# Patient Record
Sex: Male | Born: 1965 | Race: White | Hispanic: No | Marital: Married | Smoking: Never smoker
Health system: Southern US, Community
[De-identification: ages and names within clinical notes are randomized; demographics above are authoritative.]

## PROBLEM LIST (undated history)

## (undated) HISTORY — PX: OTHER SURGICAL HISTORY: SHX169

## (undated) HISTORY — PX: VASECTOMY: SHX75

---

## 1999-08-09 ENCOUNTER — Other Ambulatory Visit: Admission: RE | Admit: 1999-08-09 | Discharge: 1999-08-09 | Payer: Self-pay | Admitting: Urology

## 2012-07-01 ENCOUNTER — Encounter (HOSPITAL_BASED_OUTPATIENT_CLINIC_OR_DEPARTMENT_OTHER): Payer: Self-pay

## 2012-07-01 ENCOUNTER — Emergency Department (HOSPITAL_BASED_OUTPATIENT_CLINIC_OR_DEPARTMENT_OTHER)
Admission: EM | Admit: 2012-07-01 | Discharge: 2012-07-02 | Disposition: A | Discharge status: Home / Self Care | Payer: BC Managed Care – PPO | Attending: Gastroenterology | Admitting: Gastroenterology

## 2012-07-01 DIAGNOSIS — Y929 Unspecified place or not applicable: Secondary | ICD-10-CM | POA: Insufficient documentation

## 2012-07-01 DIAGNOSIS — Y939 Activity, unspecified: Secondary | ICD-10-CM | POA: Insufficient documentation

## 2012-07-01 DIAGNOSIS — IMO0002 Reserved for concepts with insufficient information to code with codable children: Secondary | ICD-10-CM | POA: Insufficient documentation

## 2012-07-01 DIAGNOSIS — K117 Disturbances of salivary secretion: Secondary | ICD-10-CM | POA: Insufficient documentation

## 2012-07-01 DIAGNOSIS — K208 Other esophagitis without bleeding: Secondary | ICD-10-CM | POA: Insufficient documentation

## 2012-07-01 DIAGNOSIS — R1310 Dysphagia, unspecified: Secondary | ICD-10-CM | POA: Insufficient documentation

## 2012-07-01 DIAGNOSIS — T18108A Unspecified foreign body in esophagus causing other injury, initial encounter: Secondary | ICD-10-CM | POA: Insufficient documentation

## 2012-07-01 MED ORDER — SODIUM CHLORIDE 0.9 % IV SOLN
INTRAVENOUS | Status: DC
  Administered 2012-07-01: 21:00:00 via INTRAVENOUS

## 2012-07-01 MED ORDER — GLUCAGON HCL (RDNA) 1 MG IJ SOLR
1.0000 mg | Freq: Once | INTRAMUSCULAR | Status: AC
  Administered 2012-07-01: 1 mg via INTRAVENOUS
  Filled 2012-07-01: qty 1

## 2012-07-01 NOTE — Procedures (Signed)
Physician asked if I would give water to patient and see if pt was able to swallow. Informed MD pt not able to swallow water.

## 2012-07-01 NOTE — ED Notes (Addendum)
Pt states he got choked while eating rice and beef approx 1.5 hr PTA-pt NAD-is spitting own saliva

## 2012-07-01 NOTE — ED Notes (Addendum)
Pt AAOx4 and ambulatory on scene. Pt in no apparent distress. Pt had 20 gauge IV in left AC in placed. IV flushes well. Pt reports food bolus started at 1830. Emesis x too numerous to count. Emesis is clear liquid. Denies pain.

## 2012-07-01 NOTE — ED Provider Notes (Signed)
History    CSN: [id number] Arrival date & time 07/01/12  2006 First MD Initiated Contact with Patient 07/01/12 2022     Chief Complaint  Patient presents with  . Foreign Body    HPI Pt was eating beef and rice this evening.  He tried to swallow and felt like the food got caught in his esophagus. Since that time he has felt that something is stuck in his throat.  He has not been able to handle his saliva and has been constantly spitting. Whenever he tries to drink any water he will regurgitate that back up. Pt has had trouble in the past with feeling like food was starting to get caught but was able to get the symptoms to resolve with coughing.  Since 1830 pm he has not been able to swallow.  History reviewed. No pertinent past medical history.  History reviewed. No pertinent past surgical history.  No family history on file.  History  Substance Use Topics  . Smoking status: Never Smoker   . Smokeless tobacco: Not on file  . Alcohol Use: No      Review of Systems  All other systems reviewed and are negative.    Allergies  Review of patient's allergies indicates no known allergies.  Home Medications  No current outpatient prescriptions on file.  BP 136/95  Pulse 84  Temp 98.2 F (36.8 C) (Oral)  Resp 16  Ht 6\' 2"  (1.88 m)  Wt 255 lb (115.667 kg)  BMI 32.74 kg/m2  SpO2 100%  Physical Exam  Nursing note and vitals reviewed. Constitutional: He appears well-developed and well-nourished. No distress.  HENT:  Head: Normocephalic and atraumatic.  Right Ear: External ear normal.  Left Ear: External ear normal.  Eyes: Conjunctivae normal are normal. Right eye exhibits no discharge. Left eye exhibits no discharge. No scleral icterus.  Neck: Neck supple. No tracheal deviation present.  Cardiovascular: Normal rate, regular rhythm and intact distal pulses.   Pulmonary/Chest: Effort normal and breath sounds normal. No stridor. No respiratory distress. He has no wheezes. He  has no rales.  Abdominal: Soft. Bowel sounds are normal. He exhibits no distension. There is no tenderness. There is no rebound and no guarding.  Musculoskeletal: He exhibits no edema and no tenderness.  Neurological: He is alert. He has normal strength. No sensory deficit. Cranial nerve deficit:  no gross defecits noted. He exhibits normal muscle tone. He displays no seizure activity. Coordination normal.  Skin: Skin is warm and dry. No rash noted.  Psychiatric: He has a normal mood and affect.    ED Course  Procedures (including critical care time)  Labs Reviewed - No data to display No results found.   1. Esophageal foreign body       MDM  Patient was given glucagon IV. He has not had any relief with that medication. He has a persistent esophageal foreign body/food impaction.  I will consult with gastroenterology on-call at [facility].  The patient will need to be transferred to another facility where there is gastroenterology service available.   I discussed the case with Dr. Dulce Sellar from the [facility] gastroenterology service. He will be expecting to see the patient at [facility] long emergency room department.  The patient is medically stable here. His wife is able to drive him to the emergency room at [facility] long. I think this is reasonable plan. He does not have any unstable medical issues that would require ambulance transportation.  This will expedite his care  as the wife is able to bring him there right now.      Celene Kras, MD 07/01/12 [phone]

## 2012-07-01 NOTE — ED Provider Notes (Signed)
Eric Maddox is a 47 y.o. male who was sent here from med. Center, [place], for GI evaluation for esophageal food bolus. He is comfortable, but still is expectorating, saliva.   Dr. Dulce Sellar is expecting him; and was taken to the endoscopy suite for evaluation and treatment. Vital signs, now are normal.    Flint Melter, MD 07/01/12 2352

## 2012-07-01 NOTE — ED Notes (Signed)
JWJ:[id number]<NW> Expected date:07/01/12<BR> Expected time:10:52 PM<BR> Means of arrival:Ambulance<BR> Comments:<BR> [facility] transfer

## 2012-07-02 ENCOUNTER — Encounter (HOSPITAL_COMMUNITY): Admission: EM | Disposition: A | Discharge status: Home / Self Care | Payer: Self-pay | Source: Home / Self Care

## 2012-07-02 ENCOUNTER — Encounter (HOSPITAL_COMMUNITY): Payer: Self-pay | Admitting: *Deleted

## 2012-07-02 ENCOUNTER — Ambulatory Visit (HOSPITAL_COMMUNITY): Admit: 2012-07-02 | Payer: Self-pay | Admitting: Gastroenterology

## 2012-07-02 HISTORY — PX: ESOPHAGOGASTRODUODENOSCOPY: SHX5428

## 2012-07-02 SURGERY — EGD (ESOPHAGOGASTRODUODENOSCOPY)
Anesthesia: Moderate Sedation

## 2012-07-02 MED ORDER — BUTAMBEN-TETRACAINE-BENZOCAINE 2-2-14 % EX AERO
INHALATION_SPRAY | CUTANEOUS | Status: DC | PRN
  Administered 2012-07-02: 1 via TOPICAL

## 2012-07-02 MED ORDER — FENTANYL CITRATE 0.05 MG/ML IJ SOLN
INTRAMUSCULAR | Status: DC | PRN
  Administered 2012-07-02 (×3): 25 ug via INTRAVENOUS

## 2012-07-02 MED ORDER — SODIUM CHLORIDE 0.9 % IV SOLN: INTRAVENOUS | Status: DC

## 2012-07-02 MED ORDER — MIDAZOLAM HCL 10 MG/2ML IJ SOLN
INTRAMUSCULAR | Status: DC | PRN
  Administered 2012-07-02: 2 mg via INTRAVENOUS
  Administered 2012-07-02: 1 mg via INTRAVENOUS
  Administered 2012-07-02: 2 mg via INTRAVENOUS
  Administered 2012-07-02 (×2): 1 mg via INTRAVENOUS

## 2012-07-02 NOTE — ED Notes (Signed)
Pt discharged from Endoscopy per [facility]

## 2012-07-02 NOTE — ED Notes (Signed)
Pt transported to endoscopy 

## 2012-07-02 NOTE — H&P (Signed)
[facility] Admission Note  Chief Complaint: suspected esophageal food impaction  HPI: Eric Maddox is an 47 y.o. male presenting with dysphagia, sialorrhea and suspected esophageal food impaction.  History of dysphagia with near-impaction, few episodes over the past one month.  He has no chest pain.  Sialorrhea persists.  NO abdominal pain, blood in stool, loss-of-appetite, unintentional weight loss.  History reviewed. No pertinent past medical history.  Past Surgical History  Procedure Date  . Vasectomy   . Wisdom teeth removal     Medications Prior to Admission  Medication Sig Dispense Refill  . EPINEPHrine (EPI-PEN) 0.3 mg/0.3 mL DEVI Inject 0.3 mg into the muscle as needed. For allergic reaction        Allergies:  Allergies  Allergen Reactions  . Bee Venom Anaphylaxis    History reviewed. No pertinent family history.  Social History:  reports that he has never smoked. He does not have any smokeless tobacco history on file. He reports that he does not drink alcohol or use illicit drugs.   ROS: As per HPI, all others negative   Blood pressure 137/108, pulse 71, temperature 97.8 F (36.6 C), temperature source Oral, resp. rate 21, height 6\' 2"  (1.88 m), weight 115.667 kg (255 lb), SpO2 99.00%. General appearance: NAD HEENT:  Sialorrhea CV:  RRR LUNGS: CTA ABD:  Soft  No results found for this or any previous visit (from the past 48 hour(s)). No results found.  Assessment: 1.  Suspect esophageal food impaction.  Plan: 1.  Endoscopy with foreign body extraction. 2.  Risks (bleeding, infection, bowel perforation that could require surgery, sedation-related changes in cardiopulmonary systems), benefits (identification and possible treatment of source of symptoms, exclusion of certain causes of symptoms), and alternatives (watchful waiting, radiographic imaging studies, empiric medical treatment) of upper endoscopy (EGD) were explained to patient in  detail and he wishes to proceed.  Eric Maddox M 07/02/2012, 1:20 AM

## 2012-07-02 NOTE — Op Note (Signed)
[facility] [address] [place], [address]   ENDOSCOPY PROCEDURE REPORT  PATIENT: Eric, Maddox  MR#: [id number] BIRTHDATE: 1966/06/20 , 46  yrs. old GENDER: Male ENDOSCOPIST: Willis Modena, MD REFERRED BY:  Linwood Dibbles, M.D. PROCEDURE DATE:  07/02/2012 PROCEDURE:  EGD w/ fb removal and EGD w/ biopsy ASA CLASS:     Class I INDICATIONS:  dysphagia, esophageal food impaction. MEDICATIONS: Fentanyl 75 mcg IV, and Versed 7 mg IV TOPICAL ANESTHETIC: Cetacaine spray x 2 DESCRIPTION OF PROCEDURE: After the risks benefits and alternatives of the procedure were thoroughly explained, informed consent was obtained.  The Pentax Gastroscope [id number] endoscope was introduced through the mouth and advanced to the second portion of the duodenum. Without limitations.  The instrument was slowly withdrawn as the mucosa was fully examined.   Findings: Esophageal food bolus seen in mid esophagus.  Bolus was disrupted with talon grasper device.  Some of the bolus was removed through the mouth.  The rest was gently nudged into the esophagus. The esophagus was diffusely abnormal, with multiple findings suspicious for eosinophilic esophagitis identified, including linear furrows, trachealization, mucosal friability.  No dominant esophageal stricture was identified.  Multiple esophageal biopsies were taken with cold forceps.   Remainder of esophagus, stomach, pylorus and duodenum to the second portion was grossly normal. The scope was then withdrawn from the patient and the procedure completed.  ENDOSCOPIC IMPRESSION:     Food impaction, resolved as above. 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  RECOMMENDATIONS:     1.  Watch for potential complications of procedure. 2.  Await biopsy results. 3.  Soft, pureed type diet for the next 24 hours; avoid fibrous breads, meats, vegetables until further notice. 4.  Follow-up with [facility] in the next few  weeks.  eSigned:  Willis Modena, MD 07/02/2012 1:56 AM revised

## 2012-07-05 ENCOUNTER — Encounter (HOSPITAL_COMMUNITY): Payer: Self-pay | Admitting: Gastroenterology

## 2013-06-05 ENCOUNTER — Emergency Department (HOSPITAL_COMMUNITY)
Admission: EM | Admit: 2013-06-05 | Discharge: 2013-06-05 | Disposition: A | Discharge status: Home / Self Care | Payer: BC Managed Care – PPO | Attending: Emergency Medicine | Admitting: Emergency Medicine

## 2013-06-05 ENCOUNTER — Emergency Department (HOSPITAL_COMMUNITY): Payer: BC Managed Care – PPO

## 2013-06-05 ENCOUNTER — Encounter (HOSPITAL_COMMUNITY): Payer: Self-pay | Admitting: Emergency Medicine

## 2013-06-05 DIAGNOSIS — S62639A Displaced fracture of distal phalanx of unspecified finger, initial encounter for closed fracture: Secondary | ICD-10-CM | POA: Insufficient documentation

## 2013-06-05 DIAGNOSIS — Y9389 Activity, other specified: Secondary | ICD-10-CM | POA: Insufficient documentation

## 2013-06-05 DIAGNOSIS — W230XXA Caught, crushed, jammed, or pinched between moving objects, initial encounter: Secondary | ICD-10-CM | POA: Insufficient documentation

## 2013-06-05 DIAGNOSIS — S61216A Laceration without foreign body of right little finger without damage to nail, initial encounter: Secondary | ICD-10-CM

## 2013-06-05 DIAGNOSIS — S62639B Displaced fracture of distal phalanx of unspecified finger, initial encounter for open fracture: Secondary | ICD-10-CM

## 2013-06-05 DIAGNOSIS — S61209A Unspecified open wound of unspecified finger without damage to nail, initial encounter: Secondary | ICD-10-CM | POA: Insufficient documentation

## 2013-06-05 DIAGNOSIS — Y9229 Other specified public building as the place of occurrence of the external cause: Secondary | ICD-10-CM | POA: Insufficient documentation

## 2013-06-05 MED ORDER — ONDANSETRON 4 MG PO TBDP
4.0000 mg | ORAL_TABLET | Freq: Once | ORAL | Status: AC
  Administered 2013-06-05: 4 mg via ORAL
  Filled 2013-06-05: qty 1

## 2013-06-05 MED ORDER — HYDROCODONE-ACETAMINOPHEN 5-325 MG PO TABS: 1.0000 | ORAL_TABLET | Freq: Four times a day (QID) | ORAL | Status: DC | PRN

## 2013-06-05 MED ORDER — CEPHALEXIN 500 MG PO CAPS
500.0000 mg | ORAL_CAPSULE | Freq: Once | ORAL | Status: AC
  Administered 2013-06-05: 500 mg via ORAL
  Filled 2013-06-05: qty 1

## 2013-06-05 MED ORDER — CEPHALEXIN 500 MG PO CAPS: 500.0000 mg | ORAL_CAPSULE | Freq: Three times a day (TID) | ORAL | Status: DC

## 2013-06-05 NOTE — ED Notes (Signed)
Pt states that he was closing garage door when going too fast and thought he had a good grip on it but wire cut pinky finger on right hand.

## 2013-06-05 NOTE — ED Provider Notes (Signed)
CSN: [id number]     Arrival date & time 06/05/13  1015 History   First MD Initiated Contact with Patient 06/05/13 1029     Chief Complaint  Patient presents with  . Extremity Laceration    right pinky   (Consider location/radiation/quality/duration/timing/severity/associated sxs/prior Treatment) HPI  47 year old male presents for evaluation of finger injury. Patient reports an hour ago he was closing his garage door when it was moving too fast and his right pinky finger got caught and lacerated on a compressed wire device.  C/o acute onset of sharp throbbing pain to distal tip of R pinky finger.  No other injury.  Last tetanus UTD. Report tingling sensation to distal tip.    History reviewed. No pertinent past medical history. Past Surgical History  Procedure Laterality Date  . Vasectomy    . Wisdom teeth removal    . Esophagogastroduodenoscopy  07/02/2012    Procedure: ESOPHAGOGASTRODUODENOSCOPY (EGD);  Surgeon: Willis Modena, MD;  Location: [facility];  Service: Endoscopy;  Laterality: N/A;   No family history on file. History  Substance Use Topics  . Smoking status: Never Smoker   . Smokeless tobacco: Not on file  . Alcohol Use: No    Review of Systems  Constitutional: Negative for fever.  Skin: Positive for wound.  Neurological: Negative for numbness.    Allergies  Bee venom  Home Medications   Current Outpatient Rx  Name  Route  Sig  Dispense  Refill  . EPINEPHrine (EPI-PEN) 0.3 mg/0.3 mL DEVI   Intramuscular   Inject 0.3 mg into the muscle as needed. For allergic reaction          BP 150/66  Pulse 68  Temp(Src) 98.3 F (36.8 C) (Oral)  Resp 12  Ht 6\' 2"  (1.88 m)  Wt 240 lb (108.863 kg)  BMI 30.80 kg/m2  SpO2 95% Physical Exam  Constitutional: He appears well-developed and well-nourished. No distress.  HENT:  Head: Atraumatic.  Eyes: Conjunctivae are normal.  Neck: Normal range of motion. Neck supple.  Musculoskeletal: He exhibits tenderness (R  pinky finger: a near finger tip amputation at the base of the finger nail extend to lateral aspect of finger, sensation intact distally. no joint involvement.  subungual hematoma involving 70% of nail.  4mm distal tip lac involving only the subcutaneous).  Neurological: He is alert.  Skin: No rash noted.  Psychiatric: He has a normal mood and affect.    ED Course  Procedures (including critical care time)  11:34 AM Tuft injury of R little finger, with near amputation.  Wound was thoroughly irrigated, sutured care, will obtain xray.    LACERATION REPAIR Performed by: Fayrene Helper Authorized byFayrene Helper Consent: Verbal consent obtained. Risks and benefits: risks, benefits and alternatives were discussed Consent given by: patient Patient identity confirmed: provided demographic data Prepped and Draped in normal sterile fashion Wound explored  Laceration Location: R little finger  Laceration Length: 3cm  No Foreign Bodies seen or palpated  Anesthesia: digital nerve block  Local anesthetic: lidocaine 2% w/o epinephrine  Anesthetic total: 8 ml  Irrigation method: syringe Amount of cleaning: standard  Skin closure: 5.0  Number of sutures: 7  Technique: simple interrupted  Patient tolerance: Patient tolerated the procedure well with no immediate complications.  LACERATION REPAIR Performed by: Fayrene Helper Authorized byFayrene Helper Consent: Verbal consent obtained. Risks and benefits: risks, benefits and alternatives were discussed Consent given by: patient Patient identity confirmed: provided demographic data Prepped and Draped in normal sterile fashion Wound explored  Laceration Location: tip of R little finger  Laceration Length: 0.5cm  No Foreign Bodies seen or palpated  Anesthesia: digital block  Local anesthetic: lidocaine 2% w/o epinephrine  Anesthetic total: 8 ml  Irrigation method: syringe Amount of cleaning: standard  Skin closure: prolene  5.0  Number of sutures: 3  Technique: simple interrupted, trephination of fingernail, approximation.  Patient tolerance: Patient tolerated the procedure well with no immediate complications.  12:17 PM X-ray shows evidence of a tuft fracture at the distal fifth phalangeal.. Patient on Keflex. Finger splint applied. Work note given. Return precautions discussed. Otherwise patient is stable for discharge.   Labs Review Labs Reviewed - No data to display Imaging Review Dg Finger Little Right  06/05/2013   CLINICAL DATA:  Traumatic injury and pain  EXAM: RIGHT LITTLE FINGER 2+V  COMPARISON:  None.  FINDINGS: There is a distal phalangeal tuft fracture with mild displacement. Soft tissue irregularity is noted consistent with the recent injury. No other focal abnormality is seen.  IMPRESSION: Distal 5th phalangeal tuft fracture.   Electronically Signed   By: Alcide Clever M.D.   On: 06/05/2013 12:07    EKG Interpretation   None       MDM   1. Laceration of fifth finger, right, initial encounter   2. Open fracture of tuft of distal phalanx of finger, initial encounter    BP 150/66  Pulse 68  Temp(Src) 98.3 F (36.8 C) (Oral)  Resp 12  Ht 6\' 2"  (1.88 m)  Wt 240 lb (108.863 kg)  BMI 30.80 kg/m2  SpO2 95%  I have reviewed nursing notes and vital signs. I personally reviewed the imaging tests through PACS system  I reviewed available ER/hospitalization records thought the EMR     Fayrene Helper, [place] 06/05/13 1221

## 2013-06-09 NOTE — ED Provider Notes (Signed)
Medical screening examination/treatment/procedure(s) were performed by non-physician practitioner and as supervising physician I was immediately available for consultation/collaboration.  EKG Interpretation   None         Morse Y. Debrina Kizer, MD 06/09/13 1310 

## 2017-05-04 ENCOUNTER — Encounter: Payer: Self-pay | Admitting: Podiatry

## 2017-05-04 ENCOUNTER — Ambulatory Visit: Payer: BC Managed Care – PPO | Admitting: Podiatry

## 2017-05-04 VITALS — BP 150/90 | HR 59 | Resp 16

## 2017-05-04 DIAGNOSIS — L600 Ingrowing nail: Secondary | ICD-10-CM | POA: Diagnosis not present

## 2017-05-04 NOTE — Patient Instructions (Signed)

## 2017-05-04 NOTE — Progress Notes (Signed)
   Subjective:    Patient ID: Eric LeuMichael H Maddox, male    DOB: 1965-08-10, 51 y.o.   MRN: [id number]  HPI    Review of Systems  All other systems reviewed and are negative.      Objective:   Physical Exam        Assessment & Plan:

## 2017-05-04 NOTE — Progress Notes (Signed)
Subjective:    Patient ID: Eric Maddox, male   DOB: 51 y.o.   MRN: [id number]   HPI patient presents with painful left ingrown toenails states it's been getting worse over the last couple weeks and he's tried to soak it and other modalities without relief    Review of Systems  All other systems reviewed and are negative.       Objective:  Physical Exam  Constitutional: He appears well-developed and well-nourished.  Cardiovascular: Intact distal pulses.  Pulmonary/Chest: Effort normal.  Musculoskeletal: Normal range of motion.  Neurological: He is alert.  Skin: Skin is warm.  Nursing note and vitals reviewed.  neurovascular status intact muscle strength adequate range of motion within normal limits with incurvated right hallux medial border that's very painful when pressed and makes shoe gear difficult. Patient's tried wider shoes and has tried soaks without relief of symptoms     Assessment:   Ingrown toenail deformity right hallux medial border that's very tender when pressed      Plan:   H&P condition reviewed with patient and recommended correction. I explained procedure and risk and patient signs consent form for correction and today I infiltrated the right hallux 60 mg like Marcaine mixture remove the medial border exposed matrix and applied phenol 3 applications 30 seconds followed by alcohol lavaged sterile dressing. Gave instructions on soaks and reappoint

## 2018-07-15 ENCOUNTER — Ambulatory Visit: Payer: BC Managed Care – PPO | Admitting: Podiatry

## 2018-07-15 ENCOUNTER — Ambulatory Visit (INDEPENDENT_AMBULATORY_CARE_PROVIDER_SITE_OTHER): Payer: BC Managed Care – PPO

## 2018-07-15 ENCOUNTER — Other Ambulatory Visit: Payer: Self-pay | Admitting: Podiatry

## 2018-07-15 ENCOUNTER — Encounter: Payer: Self-pay | Admitting: Podiatry

## 2018-07-15 DIAGNOSIS — M779 Enthesopathy, unspecified: Secondary | ICD-10-CM

## 2018-07-15 DIAGNOSIS — M79672 Pain in left foot: Secondary | ICD-10-CM

## 2018-07-15 DIAGNOSIS — M2042 Other hammer toe(s) (acquired), left foot: Secondary | ICD-10-CM | POA: Diagnosis not present

## 2018-07-15 DIAGNOSIS — M7752 Other enthesopathy of left foot: Secondary | ICD-10-CM

## 2018-07-15 MED ORDER — TRIAMCINOLONE ACETONIDE 10 MG/ML IJ SUSP
10.0000 mg | Freq: Once | INTRAMUSCULAR | Status: AC
  Administered 2018-07-15: 10 mg

## 2018-07-15 NOTE — Progress Notes (Signed)
Subjective:   Patient ID: Eric Maddox, male   DOB: 53 y.o.   MRN: [id number]   HPI Patient presents with exquisite discomfort in the second MPJ left with fluid buildup with mild changes of the second digit and states it seemed to occur after playing flag football 3 months ago.  Been very sore since then and has not responded to conservative treatments   ROS      Objective:  Physical Exam  Neurovascular status intact with acute inflammatory fluid buildup second MPJ left with mild digital deformity occurring and pain with palpation     Assessment:  Inflammatory capsulitis of the second MPJ left probably due to injury with possible flexor plate injury and digital deformity     Plan:  H&P conditions reviewed and I explained condition to patient.  Did proximal nerve block sterile prep applied aspirated the joint giving him a small amount of a bloody infiltrate indicating there is probably some trauma to the joint and I injected with quarter cc dexamethasone Kenalog and applied thick pad to reduce pressure on the joint surface.  Discussed possibility for immobilization and that ultimately this may require surgery and I discussed hammertoe deformity and the possibility for flexor plate injury  X-ray indicates no indications of fracture of the second metatarsal or arthritis with mild elevation second toe

## 2018-07-15 NOTE — Progress Notes (Signed)
DG  °

## 2018-08-05 ENCOUNTER — Encounter: Payer: Self-pay | Admitting: Podiatry

## 2018-08-05 ENCOUNTER — Ambulatory Visit: Payer: BC Managed Care – PPO | Admitting: Podiatry

## 2018-08-05 DIAGNOSIS — M779 Enthesopathy, unspecified: Secondary | ICD-10-CM

## 2018-08-05 NOTE — Progress Notes (Signed)
Subjective:   Patient ID: Eric Maddox, male   DOB: 53 y.o.   MRN: [id number]   HPI Patient states quite a bit improved with minimal discomfort upon plantar pressure to the forefoot   ROS      Objective:  Physical Exam  Acute capsulitis second MPJ left that is improved with mild discomfort still upon deep palpation     Assessment:  Continued improvement of the capsule left second MPJ     Plan:  H&P condition reviewed and advised on anti-inflammatories and padding and may require orthotics possible other treatments and shoe gear modifications which I explained today

## 2018-08-27 ENCOUNTER — Other Ambulatory Visit: Payer: Self-pay

## 2018-08-27 ENCOUNTER — Emergency Department (HOSPITAL_COMMUNITY)
Admission: EM | Admit: 2018-08-27 | Discharge: 2018-08-27 | Discharge status: Absent without leave | Payer: BC Managed Care – PPO

## 2019-01-12 ENCOUNTER — Other Ambulatory Visit: Payer: Self-pay | Admitting: Podiatry

## 2019-01-12 ENCOUNTER — Ambulatory Visit (INDEPENDENT_AMBULATORY_CARE_PROVIDER_SITE_OTHER): Payer: BC Managed Care – PPO

## 2019-01-12 ENCOUNTER — Encounter: Payer: Self-pay | Admitting: Podiatry

## 2019-01-12 ENCOUNTER — Ambulatory Visit: Payer: BC Managed Care – PPO | Admitting: Podiatry

## 2019-01-12 ENCOUNTER — Other Ambulatory Visit: Payer: Self-pay

## 2019-01-12 VITALS — Temp 97.4°F

## 2019-01-12 DIAGNOSIS — M21619 Bunion of unspecified foot: Secondary | ICD-10-CM | POA: Diagnosis not present

## 2019-01-12 DIAGNOSIS — M722 Plantar fascial fibromatosis: Secondary | ICD-10-CM

## 2019-01-12 DIAGNOSIS — M79671 Pain in right foot: Secondary | ICD-10-CM

## 2019-01-12 MED ORDER — DICLOFENAC SODIUM 75 MG PO TBEC: 75.0000 mg | DELAYED_RELEASE_TABLET | Freq: Two times a day (BID) | ORAL | 2 refills | Status: DC

## 2019-01-12 NOTE — Patient Instructions (Signed)

## 2019-01-12 NOTE — Progress Notes (Signed)
Subjective:   Patient ID: Eric Maddox, male   DOB: 53 y.o.   MRN: [id number]   HPI Patient presents stating he has been having a lot of pain underneath his right heel over the last month and has bunion deformity right over left and states the left one seems to be doing better with what we did last visit   ROS      Objective:  Physical Exam  Neurovascular status intact with exquisite discomfort right plantar fascial insertional point tendon calcaneus with inflammation fluid buildup and moderate structural bunion deformity right over left     Assessment:  H&P condition reviewed and at this point I did discuss and explained plantar fasciitis and bunion deformity     Plan:  Reviewed x-rays and I did do sterile prep injected the medial fascial band right 3 mg Kenalog 5 mg Xylocaine and applied fascial brace and discussed long-term orthotics.  Reappoint to recheck in several weeks  X-rays indicate that there is moderate depression of the arch with no indication of acute pathology.  Plantar spurs noted and moderate bunion deformity noted

## 2019-01-27 ENCOUNTER — Other Ambulatory Visit: Payer: Self-pay

## 2019-01-27 ENCOUNTER — Ambulatory Visit: Payer: BC Managed Care – PPO | Admitting: Podiatry

## 2019-01-27 ENCOUNTER — Encounter: Payer: Self-pay | Admitting: Podiatry

## 2019-01-27 VITALS — Temp 97.3°F

## 2019-01-27 DIAGNOSIS — M722 Plantar fascial fibromatosis: Secondary | ICD-10-CM

## 2019-01-27 NOTE — Progress Notes (Signed)
Subjective:   Patient ID: Eric Maddox, male   DOB: 53 y.o.   MRN: [id number]   HPI Patient states that the right heel has been feeling really good and she did have trouble wearing the brace   ROS      Objective:  Physical Exam  Neurovascular status intact with discomfort in the right plantar heel which is minimal upon deep palpation and significantly improved from last visit     Assessment:  Plantar fasciitis right improved with very mild discomfort     Plan:  H&P discussed condition and went ahead today discussed the utilization of anti-inflammatories physical therapy and support and hopefully this will be the end of the problem but will be seen back if any issues were to occur

## 2019-09-04 ENCOUNTER — Ambulatory Visit (HOSPITAL_COMMUNITY)
Admission: EM | Admit: 2019-09-04 | Discharge: 2019-09-04 | Disposition: A | Discharge status: Home / Self Care | Payer: BC Managed Care – PPO | Attending: Gastroenterology | Admitting: Gastroenterology

## 2019-09-04 ENCOUNTER — Other Ambulatory Visit: Payer: Self-pay

## 2019-09-04 ENCOUNTER — Encounter (HOSPITAL_COMMUNITY): Payer: Self-pay

## 2019-09-04 ENCOUNTER — Encounter (HOSPITAL_COMMUNITY)
Admission: EM | Disposition: A | Discharge status: Home / Self Care | Payer: Self-pay | Source: Home / Self Care | Attending: Emergency Medicine

## 2019-09-04 ENCOUNTER — Emergency Department (HOSPITAL_COMMUNITY): Payer: BC Managed Care – PPO | Admitting: Certified Registered Nurse Anesthetist

## 2019-09-04 DIAGNOSIS — K219 Gastro-esophageal reflux disease without esophagitis: Secondary | ICD-10-CM | POA: Insufficient documentation

## 2019-09-04 DIAGNOSIS — T18128A Food in esophagus causing other injury, initial encounter: Secondary | ICD-10-CM | POA: Insufficient documentation

## 2019-09-04 DIAGNOSIS — Z20822 Contact with and (suspected) exposure to covid-19: Secondary | ICD-10-CM | POA: Insufficient documentation

## 2019-09-04 DIAGNOSIS — Z91030 Bee allergy status: Secondary | ICD-10-CM | POA: Diagnosis not present

## 2019-09-04 DIAGNOSIS — X58XXXA Exposure to other specified factors, initial encounter: Secondary | ICD-10-CM | POA: Diagnosis not present

## 2019-09-04 DIAGNOSIS — K200 Eosinophilic esophagitis: Secondary | ICD-10-CM | POA: Diagnosis not present

## 2019-09-04 DIAGNOSIS — T17208A Unspecified foreign body in pharynx causing other injury, initial encounter: Secondary | ICD-10-CM | POA: Diagnosis present

## 2019-09-04 DIAGNOSIS — Z791 Long term (current) use of non-steroidal anti-inflammatories (NSAID): Secondary | ICD-10-CM | POA: Insufficient documentation

## 2019-09-04 DIAGNOSIS — K3189 Other diseases of stomach and duodenum: Secondary | ICD-10-CM | POA: Diagnosis not present

## 2019-09-04 DIAGNOSIS — Z91018 Allergy to other foods: Secondary | ICD-10-CM | POA: Diagnosis not present

## 2019-09-04 DIAGNOSIS — Z79899 Other long term (current) drug therapy: Secondary | ICD-10-CM | POA: Diagnosis not present

## 2019-09-04 HISTORY — PX: FOREIGN BODY REMOVAL: SHX962

## 2019-09-04 HISTORY — PX: ESOPHAGOGASTRODUODENOSCOPY (EGD) WITH PROPOFOL: SHX5813

## 2019-09-04 LAB — CBC WITH DIFFERENTIAL/PLATELET
Abs Immature Granulocytes: 0.01 10*3/uL (ref 0.00–0.07)
Basophils Absolute: 0.1 10*3/uL (ref 0.0–0.1)
Basophils Relative: 1 %
Eosinophils Absolute: 0.3 10*3/uL (ref 0.0–0.5)
Eosinophils Relative: 6 %
HCT: 49.2 % (ref 39.0–52.0)
Hemoglobin: 16.3 g/dL (ref 13.0–17.0)
Immature Granulocytes: 0 %
Lymphocytes Relative: 34 %
Lymphs Abs: 1.7 10*3/uL (ref 0.7–4.0)
MCH: 28.2 pg (ref 26.0–34.0)
MCHC: 33.1 g/dL (ref 30.0–36.0)
MCV: 85 fL (ref 80.0–100.0)
Monocytes Absolute: 0.4 10*3/uL (ref 0.1–1.0)
Monocytes Relative: 7 %
Neutro Abs: 2.7 10*3/uL (ref 1.7–7.7)
Neutrophils Relative %: 52 %
Platelets: 177 10*3/uL (ref 150–400)
RBC: 5.79 MIL/uL (ref 4.22–5.81)
RDW: 12.2 % (ref 11.5–15.5)
WBC: 5.1 10*3/uL (ref 4.0–10.5)
nRBC: 0 % (ref 0.0–0.2)

## 2019-09-04 LAB — BASIC METABOLIC PANEL
Anion gap: 11 (ref 5–15)
BUN: 22 mg/dL — ABNORMAL HIGH (ref 6–20)
CO2: 29 mmol/L (ref 22–32)
Calcium: 9.6 mg/dL (ref 8.9–10.3)
Chloride: 101 mmol/L (ref 98–111)
Creatinine, Ser: 1.4 mg/dL — ABNORMAL HIGH (ref 0.61–1.24)
GFR calc Af Amer: >60 mL/min (ref >60)
GFR calc non Af Amer: 57 mL/min — ABNORMAL LOW (ref >60)
Glucose, Bld: 104 mg/dL — ABNORMAL HIGH (ref 70–99)
Potassium: 3.8 mmol/L (ref 3.5–5.1)
Sodium: 141 mmol/L (ref 135–145)

## 2019-09-04 LAB — RESPIRATORY PANEL BY RT PCR (FLU A&B, COVID)
Influenza A by PCR: NEGATIVE
Influenza B by PCR: NEGATIVE
SARS Coronavirus 2 by RT PCR: NEGATIVE

## 2019-09-04 SURGERY — ESOPHAGOGASTRODUODENOSCOPY (EGD) WITH PROPOFOL
Anesthesia: General

## 2019-09-04 MED ORDER — SUCCINYLCHOLINE CHLORIDE 20 MG/ML IJ SOLN
INTRAMUSCULAR | Status: DC | PRN
  Administered 2019-09-04: 80 mg via INTRAVENOUS

## 2019-09-04 MED ORDER — SODIUM CHLORIDE 0.9 % IV SOLN: INTRAVENOUS | Status: DC

## 2019-09-04 MED ORDER — PROPOFOL 10 MG/ML IV BOLUS
INTRAVENOUS | Status: DC | PRN
  Administered 2019-09-04: 200 mg via INTRAVENOUS

## 2019-09-04 MED ORDER — GLUCAGON HCL RDNA (DIAGNOSTIC) 1 MG IJ SOLR
1.0000 mg | Freq: Once | INTRAMUSCULAR | Status: AC
  Administered 2019-09-04: 1 mg via INTRAVENOUS
  Filled 2019-09-04: qty 1

## 2019-09-04 MED ORDER — ACETAMINOPHEN 10 MG/ML IV SOLN: 1000.0000 mg | Freq: Once | INTRAVENOUS | Status: DC | PRN

## 2019-09-04 MED ORDER — SODIUM CHLORIDE 0.9 % IV BOLUS
500.0000 mL | Freq: Once | INTRAVENOUS | Status: AC
  Administered 2019-09-04: 500 mL via INTRAVENOUS

## 2019-09-04 MED ORDER — FENTANYL CITRATE (PF) 100 MCG/2ML IJ SOLN: 25.0000 ug | INTRAMUSCULAR | Status: DC | PRN

## 2019-09-04 MED ORDER — LACTATED RINGERS IV SOLN: INTRAVENOUS | Status: DC

## 2019-09-04 MED ORDER — ACETAMINOPHEN 160 MG/5ML PO SOLN: 1000.0000 mg | Freq: Once | ORAL | Status: DC | PRN

## 2019-09-04 MED ORDER — DEXAMETHASONE SODIUM PHOSPHATE 4 MG/ML IJ SOLN
INTRAMUSCULAR | Status: DC | PRN
  Administered 2019-09-04: 5 mg via INTRAVENOUS

## 2019-09-04 MED ORDER — LIDOCAINE HCL (CARDIAC) PF 100 MG/5ML IV SOSY
PREFILLED_SYRINGE | INTRAVENOUS | Status: DC | PRN
  Administered 2019-09-04: 60 mg via INTRAVENOUS

## 2019-09-04 MED ORDER — ACETAMINOPHEN 500 MG PO TABS: 1000.0000 mg | ORAL_TABLET | Freq: Once | ORAL | Status: DC | PRN

## 2019-09-04 MED ORDER — OXYCODONE HCL 5 MG PO TABS: 5.0000 mg | ORAL_TABLET | Freq: Once | ORAL | Status: DC | PRN

## 2019-09-04 MED ORDER — ONDANSETRON HCL 4 MG/2ML IJ SOLN
INTRAMUSCULAR | Status: DC | PRN
  Administered 2019-09-04: 4 mg via INTRAVENOUS

## 2019-09-04 MED ORDER — PANTOPRAZOLE SODIUM 40 MG PO TBEC: 40.0000 mg | DELAYED_RELEASE_TABLET | Freq: Two times a day (BID) | ORAL | 1 refills | Status: AC

## 2019-09-04 MED ORDER — OXYCODONE HCL 5 MG/5ML PO SOLN: 5.0000 mg | Freq: Once | ORAL | Status: DC | PRN

## 2019-09-04 MED ORDER — STERILE WATER FOR INJECTION IJ SOLN
INTRAMUSCULAR | Status: AC
  Filled 2019-09-04: qty 10

## 2019-09-04 MED ORDER — LACTATED RINGERS IV SOLN: INTRAVENOUS | Status: DC | PRN

## 2019-09-04 SURGICAL SUPPLY — 14 items

## 2019-09-04 NOTE — ED Triage Notes (Signed)
Pt has chicken lodged in throat from lunch today. No respiratory distress. He has had previous episodes.

## 2019-09-04 NOTE — ED Notes (Signed)
Pt transported to Endo 

## 2019-09-04 NOTE — Brief Op Note (Signed)
09/04/2019  6:34 PM  PATIENT:  Astrid Divine Goynes  54 y.o. male  PRE-OPERATIVE DIAGNOSIS:  food bolus with eosinophilic esophagitis  POST-OPERATIVE DIAGNOSIS:  large food bolus removed, rest pushed into stomach  PROCEDURE:  Procedure(s): ESOPHAGOGASTRODUODENOSCOPY (EGD) WITH PROPOFOL (N/A) FOREIGN BODY REMOVAL  SURGEON:  Surgeon(s) and Role:    Ronnette Juniper, MD - Primary  PHYSICIAN ASSISTANT:   ASSISTANTS: Grace Isaac, RN, Carlyle Basques, Tech   ANESTHESIA:   MAC  EBL:  Minimal  BLOOD ADMINISTERED:none  DRAINS: none   LOCAL MEDICATIONS USED:  NONE  SPECIMEN:  No Specimen  DISPOSITION OF SPECIMEN:  N/A  COUNTS:  YES  TOURNIQUET:  * No tourniquets in log *  DICTATION: .Dragon Dictation  PLAN OF CARE: Discharge to home after PACU  PATIENT DISPOSITION:  PACU - hemodynamically stable.   Delay start of Pharmacological VTE agent (>24hrs) due to surgical blood loss or risk of bleeding: no

## 2019-09-04 NOTE — Anesthesia Procedure Notes (Addendum)
Procedure Name: Intubation Date/Time: 09/04/2019 6:23 PM Performed by: Oletta Lamas, CRNA Pre-anesthesia Checklist: Patient identified, Emergency Drugs available, Suction available and Patient being monitored Patient Re-evaluated:Patient Re-evaluated prior to induction Oxygen Delivery Method: Circle System Utilized Preoxygenation: Pre-oxygenation with 100% oxygen Induction Type: IV induction, Rapid sequence and Cricoid Pressure applied Laryngoscope Size: Mac and 4 Grade View: Grade III Tube type: Oral Tube size: 7.5 mm Number of attempts: 1 Airway Equipment and Method: Stylet Placement Confirmation: ETT inserted through vocal cords under direct vision,  positive ETCO2 and breath sounds checked- equal and bilateral Secured at: 23 cm Tube secured with: Tape Dental Injury: Teeth and Oropharynx as per pre-operative assessment

## 2019-09-04 NOTE — H&P (Signed)
Eric Maddox is an 54 y.o. male.   Chief Complaint: Chicken stuck in throat HPI: 54 year old male, with history of eosinophilic esophagitis, on biopsies from endoscopy from 2014 when he presented with a similar episode of food bolus impaction, presents to the ED with inability to swallow food or drinks as he thinks a piece of chicken is lodged in his lower throat.  Over the last 7 years he has had a few episodes of food getting stuck on eating, especially chicken or steak. He has had an allergy testing and was told to avoid tree nuts. He has mild acid reflux and heartburn but does not take any medications for it.  Patient  had a colonoscopy in 2016, had a tubular adenoma removed and was advised to repeat it in 5 years-this year.  History reviewed. No pertinent past medical history.  Past Surgical History:  Procedure Laterality Date  . ESOPHAGOGASTRODUODENOSCOPY  07/02/2012   Procedure: ESOPHAGOGASTRODUODENOSCOPY (EGD);  Surgeon: Willis Modena, MD;  Location: [facility];  Service: Endoscopy;  Laterality: N/A;  . VASECTOMY    . wisdom teeth removal      History reviewed. No pertinent family history. Social History:  reports that he has never smoked. He has never used smokeless tobacco. He reports that he does not drink alcohol or use drugs.  Allergies:  Allergies  Allergen Reactions  . Bee Venom Anaphylaxis  . Other     Tree nuts    Medications Prior to Admission  Medication Sig Dispense Refill  . EPINEPHrine (EPI-PEN) 0.3 mg/0.3 mL DEVI Inject 0.3 mg into the muscle as needed. For allergic reaction    . ibuprofen (ADVIL,MOTRIN) 200 MG tablet Take 200 mg by mouth every 6 (six) hours as needed for moderate pain.     Marland Kitchen diclofenac (VOLTAREN) 75 MG EC tablet Take 1 tablet (75 mg total) by mouth 2 (two) times daily. (Patient not taking: Reported on 09/04/2019) 50 tablet 2    Results for orders placed or performed during the hospital encounter of 09/04/19 (from the past 48  hour(s))  CBC with Differential     Status: None   Collection Time: 09/04/19  3:31 PM  Result Value Ref Range   WBC 5.1 4.0 - 10.5 K/uL   RBC 5.79 4.22 - 5.81 MIL/uL   Hemoglobin 16.3 13.0 - 17.0 g/dL   HCT 02.5 42.7 - 06.2 %   MCV 85.0 80.0 - 100.0 fL   MCH 28.2 26.0 - 34.0 pg   MCHC 33.1 30.0 - 36.0 g/dL   RDW 37.6 28.3 - 15.1 %   Platelets 177 150 - 400 K/uL   nRBC 0.0 0.0 - 0.2 %   Neutrophils Relative % 52 %   Neutro Abs 2.7 1.7 - 7.7 K/uL   Lymphocytes Relative 34 %   Lymphs Abs 1.7 0.7 - 4.0 K/uL   Monocytes Relative 7 %   Monocytes Absolute 0.4 0.1 - 1.0 K/uL   Eosinophils Relative 6 %   Eosinophils Absolute 0.3 0.0 - 0.5 K/uL   Basophils Relative 1 %   Basophils Absolute 0.1 0.0 - 0.1 K/uL   Immature Granulocytes 0 %   Abs Immature Granulocytes 0.01 0.00 - 0.07 K/uL    Comment: Performed at [facility], [address], [place], [place] [address]  Basic metabolic panel     Status: Abnormal   Collection Time: 09/04/19  3:31 PM  Result Value Ref Range   Sodium 141 135 - 145 mmol/L   Potassium  3.8 3.5 - 5.1 mmol/L   Chloride 101 98 - 111 mmol/L   CO2 29 22 - 32 mmol/L   Glucose, Bld 104 (H) 70 - 99 mg/dL    Comment: Glucose reference range applies only to samples taken after fasting for at least 8 hours.   BUN 22 (H) 6 - 20 mg/dL   Creatinine, Ser 1.40 (H) 0.61 - 1.24 mg/dL   Calcium 9.6 8.9 - 10.3 mg/dL   GFR calc non Af Amer 57 (L) >60 mL/min   GFR calc Af Amer >60 >60 mL/min   Anion gap 11 5 - 15    Comment: Performed at [facility] [address], [place], [place] [address]  Respiratory Panel by RT PCR (Flu A&B, Covid) - Nasopharyngeal Swab     Status: None   Collection Time: 09/04/19  3:31 PM   Specimen: Nasopharyngeal Swab  Result Value Ref Range   SARS Coronavirus 2 by RT PCR NEGATIVE NEGATIVE    Comment: (NOTE) SARS-CoV-2 target nucleic acids are NOT DETECTED. The SARS-CoV-2 RNA is generally detectable in upper respiratoy specimens during  the acute phase of infection. The lowest concentration of SARS-CoV-2 viral copies this assay can detect is 131 copies/mL. A negative result does not preclude SARS-Cov-2 infection and should not be used as the sole basis for treatment or other patient management decisions. A negative result may occur with  improper specimen collection/handling, submission of specimen other than nasopharyngeal swab, presence of viral mutation(s) within the areas targeted by this assay, and inadequate number of viral copies (<131 copies/mL). A negative result must be combined with clinical observations, patient history, and epidemiological information. The expected result is Negative. Fact Sheet for Patients:  PinkCheek.be Fact Sheet for Healthcare Providers:  GravelBags.it This test is not yet ap proved or cleared by the [place] FDA and  has been authorized for detection and/or diagnosis of SARS-CoV-2 by FDA under an Emergency Use Authorization (EUA). This EUA will remain  in effect (meaning this test can be used) for the duration of the COVID-19 declaration under Section 564(b)(1) of the Act, 21 U.S.C. section 360bbb-3(b)(1), unless the authorization is terminated or revoked sooner.    Influenza A by PCR NEGATIVE NEGATIVE   Influenza B by PCR NEGATIVE NEGATIVE    Comment: (NOTE) The Xpert Xpress SARS-CoV-2/FLU/RSV assay is intended as an aid in  the diagnosis of influenza from Nasopharyngeal swab specimens and  should not be used as a sole basis for treatment. Nasal washings and  aspirates are unacceptable for Xpert Xpress SARS-CoV-2/FLU/RSV  testing. Fact Sheet for Patients: PinkCheek.be Fact Sheet for Healthcare Providers: GravelBags.it This test is not yet approved or cleared by the [place] FDA and  has been authorized for detection and/or diagnosis of SARS-CoV-2 by  FDA  under an Emergency Use Authorization (EUA). This EUA will remain  in effect (meaning this test can be used) for the duration of the  Covid-19 declaration under Section 564(b)(1) of the Act, 21  U.S.C. section 360bbb-3(b)(1), unless the authorization is  terminated or revoked. Performed at [facility], [place] [address], [place], [place] [address]    No results found.  Review of Systems  Constitutional: Negative.   Eyes: Negative.   Respiratory: Negative.   Cardiovascular: Negative.   Gastrointestinal: Positive for nausea and vomiting.  Genitourinary: Negative.   Allergic/Immunologic: Positive for food allergies.  Psychiatric/Behavioral: Negative.     Blood pressure (!) 122/98, pulse (!) 59, temperature 97.9 F (36.6  C), temperature source Oral, resp. rate 17, height 6\' 2"  (1.88 m), weight 117.9 kg, SpO2 99 %. Physical Exam  Constitutional: He is oriented to person, place, and time. He appears well-developed and well-nourished.  HENT:  Head: Normocephalic and atraumatic.  Eyes: Conjunctivae are normal.  Cardiovascular: Normal rate and regular rhythm.  Respiratory: Effort normal and breath sounds normal.  Musculoskeletal:     Cervical back: Neck supple.  Neurological: He is alert and oriented to person, place, and time.  Psychiatric: He has a normal mood and affect.  Has an emesis bag and continues to spit gastric secretions.  Assessment/Plan Esophageal food bolus impaction. History of eosinophilic esophagitis.  Recommend emergent endoscopy for removal of food bolus. The risks(bleeding, perforation) and the benefits of the procedure were discussed with the patient in detail. He understands and verbalizes consent.  , MD 09/04/2019, 6:02 PM

## 2019-09-04 NOTE — ED Provider Notes (Signed)
[facility] EMERGENCY DEPARTMENT Provider Note   CSN: [id number] Arrival date & time: 09/04/19  1450     History Chief Complaint  Patient presents with  . foreign body in throat    GRAHAM HYUN is a 54 y.o. male.  Chief complaint plain: Chicken bolus stuck in esophagus from lunch today.  Not able to swallow.  Normal respirations.  Previous episode with foreign body in throat requiring endoscopy.  Severity is moderate.        History reviewed. No pertinent past medical history.  There are no problems to display for this patient.   Past Surgical History:  Procedure Laterality Date  . ESOPHAGOGASTRODUODENOSCOPY  07/02/2012   Procedure: ESOPHAGOGASTRODUODENOSCOPY (EGD);  Surgeon: Willis Modena, MD;  Location: [facility];  Service: Endoscopy;  Laterality: N/A;  . VASECTOMY    . wisdom teeth removal         History reviewed. No pertinent family history.  Social History   Tobacco Use  . Smoking status: Never Smoker  . Smokeless tobacco: Never Used  Substance Use Topics  . Alcohol use: No  . Drug use: No    Home Medications Prior to Admission medications   Medication Sig Start Date End Date Taking? Authorizing Provider  diclofenac (VOLTAREN) 75 MG EC tablet Take 1 tablet (75 mg total) by mouth 2 (two) times daily. 01/12/19   Lenn Sink, DPM  EPINEPHrine (EPI-PEN) 0.3 mg/0.3 mL DEVI Inject 0.3 mg into the muscle as needed. For allergic reaction    [provider]  ibuprofen (ADVIL,MOTRIN) 200 MG tablet Take 200 mg every 6 (six) hours as needed by mouth.    [provider]  Influenza Vac Subunit Quad (FLUCELVAX QUADRIVALENT) SUSP injection Flucelvax Quad 2019-2020 60 mcg (15 mcg x 4)/0.5 mL intramuscular susp  TO BE ADMINISTERED BY PHARMACIST FOR IMMUNIZATION    [provider]    Allergies    Bee venom and Other  Review of Systems   Review of Systems  All other systems reviewed and are  negative.   Physical Exam Updated Vital Signs BP (!) 161/101   Pulse 66   Temp 98 F (36.7 C) (Oral)   Resp 18   Ht 6\' 2"  (1.88 m)   Wt 117.9 kg   SpO2 100%   BMI 33.38 kg/m   Physical Exam Vitals and nursing note reviewed.  Constitutional:      Appearance: Normal appearance. He is well-developed.     Comments: Spitting up; no acute distress.  HENT:     Head: Normocephalic and atraumatic.  Eyes:     Conjunctiva/sclera: Conjunctivae normal.  Cardiovascular:     Rate and Rhythm: Normal rate and regular rhythm.  Pulmonary:     Effort: Pulmonary effort is normal.     Breath sounds: Normal breath sounds.  Abdominal:     General: Bowel sounds are normal.     Palpations: Abdomen is soft.  Musculoskeletal:        General: Normal range of motion.     Cervical back: Neck supple.  Skin:    General: Skin is warm and dry.  Neurological:     General: No focal deficit present.     Mental Status: He is alert and oriented to person, place, and time.  Psychiatric:        Behavior: Behavior normal.     ED Results / Procedures / Treatments   Labs (all labs ordered are listed, but only abnormal results are displayed)  Labs Reviewed  RESPIRATORY PANEL BY RT PCR (FLU A&B, COVID)  CBC WITH DIFFERENTIAL/PLATELET  BASIC METABOLIC PANEL    EKG None  Radiology No results found.  Procedures Procedures (including critical care time)  Medications Ordered in ED Medications  glucagon (human recombinant) (GLUCAGEN) injection 1 mg (has no administration in time range)  sodium chloride 0.9 % bolus 500 mL (has no administration in time range)    ED Course  I have reviewed the triage vital signs and the nursing notes.  Pertinent labs & imaging results that were available during my care of the patient were reviewed by me and considered in my medical decision making (see chart for details).    MDM Rules/Calculators/A&P                      History and physical consistent with  foreign body in esophagus.  Will try IV glucagon.  Consult gastroenterology. Final Clinical Impression(s) / ED Diagnoses Final diagnoses:  Foreign body in pharynx, initial encounter    Rx / DC Orders ED Discharge Orders    None       Nat Christen, MD 09/04/19 1536

## 2019-09-04 NOTE — Op Note (Signed)
[facility] Patient Name: Eric Maddox Procedure Date : 09/04/2019 MRN: [id number] Attending MD: Kerin Salen , MD Date of Birth: 04-29-1966 CSN: [id number] Age: 54 Admit Type: Inpatient Procedure:                Upper GI endoscopy Indications:              Foreign body in the esophagus,Esophageal food bolus                            impaction Providers:                Kerin Salen, MD, Vicki Mallet, RN, Arlee Muslim                            Tech., Technician, Everardo Pacific, Technician, Kelly Splinter, CRNA Referring MD:             ER Medicines:                Monitored Anesthesia Care Complications:            No immediate complications. Estimated blood loss:                            None. Estimated Blood Loss:     Estimated blood loss: none. Procedure:                Pre-Anesthesia Assessment:                           - Prior to the procedure, a History and Physical                            was performed, and patient medications and                            allergies were reviewed. The patient's tolerance of                            previous anesthesia was also reviewed. The risks                            and benefits of the procedure and the sedation                            options and risks were discussed with the patient.                            All questions were answered, and informed consent                            was obtained. Prior Anticoagulants: The patient has                            taken no previous anticoagulant or  antiplatelet                            agents. ASA Grade Assessment: I - A normal, healthy                            patient. After reviewing the risks and benefits,                            the patient was deemed in satisfactory condition to                            undergo the procedure.                           After obtaining informed consent, the endoscope was            passed under direct vision. Throughout the                            procedure, the patient's blood pressure, pulse, and                            oxygen saturations were monitored continuously. The                            GIF-H190 ([id number]) Olympus gastroscope was                            introduced through the mouth, and advanced to the                            second part of duodenum. The upper GI endoscopy was                            accomplished without difficulty. The patient                            tolerated the procedure well. Scope In: Scope Out: Findings:      Food was found in the middle third of the esophagus. Removal of food was       accomplished using a basket.      Localized moderate mucosal changes characterized by congestion,       erythema, friability (with contact bleeding), inflammation, longitudinal       markings and altered texture were found in the middle third of the       esophagus.      Mucosal changes including ringed esophagus, longitudinal furrows,       circumferential folds, congestion (edema), longitudinal markings and       mucosal friability were found in the middle third of the esophagus.       Esophageal findings were graded using the Eosinophilic Esophagitis       Endoscopic Reference Score (EoE-EREFS) as: Edema Grade 1 Present       (decreased clarity or absence of vascular markings), Rings Grade 2       Moderate (distinct rings that  do not occlude passage of diagnostic 8-10       mm endoscope), Exudates Grade 0 None (no white lesions seen), Furrows       Grade 1 Present (vertical lines with or without visible depth) and       Stricture none (no stricture found).      A medium amount of food (residue) was found in the cardia and in the       gastric fundus.      Localized mildly erythematous mucosa without bleeding was found in the       gastric antrum.      The examined duodenum was normal. Impression:               -  Food in the middle third of the esophagus.                            Removal was successful.                           - Congestion, erythema, friability (with contact                            bleeding), inflammation, longitudinal markings and                            altered texture mucosa in the esophagus.                           - Esophageal mucosal changes consistent with                            eosinophilic esophagitis.                           - A medium amount of food (residue) in the stomach.                           - Erythematous mucosa in the antrum.                           - Normal examined duodenum. Moderate Sedation:      Patient did not receive moderate sedation for this procedure, but       instead received monitored anesthesia care. Recommendation:           - Advance diet as tolerated.                           - Patient will need oral steroids and PPI for                            treatment for eosinophilic esophagitis.                           - F/U with Dr.Outlaw as outpatient. Procedure Code(s):        --- Professional ---  [phone], Esophagogastroduodenoscopy, flexible,                            transoral; with removal of foreign body(s) Diagnosis Code(s):        --- Professional ---                           K74.259D, Food in esophagus causing other injury,                            initial encounter                           K22.8, Other specified diseases of esophagus                           T18.2XXA, Foreign body in stomach, initial encounter                           K31.89, Other diseases of stomach and duodenum                           T18.108A, Unspecified foreign body in esophagus                            causing other injury, initial encounter CPT copyright 2019 American [employer] Association. All rights reserved. The codes documented in this report are preliminary and upon coder review may  be revised to meet  current compliance requirements. Kerin Salen, MD 09/04/2019 6:41:07 PM This report has been signed electronically. Number of Addenda: 0

## 2019-09-04 NOTE — Transfer of Care (Signed)
Immediate Anesthesia Transfer of Care Note  Patient: Eric Maddox  Procedure(s) Performed: ESOPHAGOGASTRODUODENOSCOPY (EGD) WITH PROPOFOL (N/A ) FOREIGN BODY REMOVAL  Patient Location: PACU  Anesthesia Type:General  Level of Consciousness: awake, alert , oriented and patient cooperative  Airway & Oxygen Therapy: Patient Spontanous Breathing  Post-op Assessment: Report given to RN and Post -op Vital signs reviewed and stable  Post vital signs: Reviewed and stable  Last Vitals:  Vitals Value Taken Time  BP    Temp    Pulse    Resp    SpO2      Last Pain:  Vitals:   09/04/19 1758  TempSrc: Oral  PainSc: 0-No pain         Complications: No apparent anesthesia complications

## 2019-09-04 NOTE — Anesthesia Preprocedure Evaluation (Signed)
Anesthesia Evaluation  Patient identified by MRN, date of birth, ID band Patient awake    Reviewed: Allergy & Precautions, NPO status , Patient's Chart, lab work & pertinent test results  History of Anesthesia Complications Negative for: history of anesthetic complications  Airway Mallampati: III  TM Distance: >3 FB Neck ROM: Full    Dental  (+) Dental Advisory Given, Teeth Intact   Pulmonary neg pulmonary ROS, neg recent URI,    breath sounds clear to auscultation       Cardiovascular negative cardio ROS   Rhythm:Regular     Neuro/Psych negative neurological ROS  negative psych ROS   GI/Hepatic Neg liver ROS, food bolus with eosinophilic esophagitis   Endo/Other  negative endocrine ROS  Renal/GU negative Renal ROS     Musculoskeletal negative musculoskeletal ROS (+)   Abdominal   Peds  Hematology negative hematology ROS (+)   Anesthesia Other Findings   Reproductive/Obstetrics                             Anesthesia Physical Anesthesia Plan  ASA: I and emergent  Anesthesia Plan: General   Post-op Pain Management:    Induction: Intravenous, Rapid sequence and Cricoid pressure planned  PONV Risk Score and Plan: 2 and Ondansetron and Dexamethasone  Airway Management Planned: Oral ETT  Additional Equipment: None  Intra-op Plan:   Post-operative Plan: Extubation in OR  Informed Consent: I have reviewed the patients History and Physical, chart, labs and discussed the procedure including the risks, benefits and alternatives for the proposed anesthesia with the patient or authorized representative who has indicated his/her understanding and acceptance.     Dental advisory given  Plan Discussed with: CRNA and Surgeon  Anesthesia Plan Comments:         Anesthesia Quick Evaluation

## 2019-09-09 ENCOUNTER — Encounter: Payer: Self-pay | Admitting: Anesthesiology

## 2019-09-09 NOTE — Anesthesia Postprocedure Evaluation (Signed)
Anesthesia Post Note  Patient: Eric Maddox  Procedure(s) Performed: ESOPHAGOGASTRODUODENOSCOPY (EGD) WITH PROPOFOL (N/A ) FOREIGN BODY REMOVAL     Patient location during evaluation: Endoscopy Anesthesia Type: General Level of consciousness: awake and alert Pain management: pain level controlled Vital Signs Assessment: post-procedure vital signs reviewed and stable Respiratory status: spontaneous breathing, nonlabored ventilation, respiratory function stable and patient connected to nasal cannula oxygen Cardiovascular status: blood pressure returned to baseline and stable Postop Assessment: no apparent nausea or vomiting Anesthetic complications: no    Last Vitals:  Vitals:   09/04/19 1845 09/04/19 1900  BP: (!) 140/91 135/88  Pulse: 72 77  Resp: 15 17  Temp: 36.7 C 36.7 C  SpO2: 93% 95%    Last Pain:  Vitals:   09/04/19 1900  TempSrc:   PainSc: 0-No pain                 Beretta Ginsberg

## 2019-10-21 ENCOUNTER — Other Ambulatory Visit: Payer: Self-pay | Admitting: Family Medicine

## 2019-10-21 DIAGNOSIS — N289 Disorder of kidney and ureter, unspecified: Secondary | ICD-10-CM

## 2019-10-26 ENCOUNTER — Ambulatory Visit
Admission: RE | Admit: 2019-10-26 | Discharge: 2019-10-26 | Disposition: A | Discharge status: Home / Self Care | Payer: BC Managed Care – PPO | Source: Ambulatory Visit | Attending: Family Medicine | Admitting: Family Medicine

## 2019-10-26 DIAGNOSIS — N289 Disorder of kidney and ureter, unspecified: Secondary | ICD-10-CM

## 2020-02-03 DIAGNOSIS — M7541 Impingement syndrome of right shoulder: Secondary | ICD-10-CM | POA: Insufficient documentation

## 2020-03-17 ENCOUNTER — Other Ambulatory Visit: Payer: Self-pay

## 2020-03-17 ENCOUNTER — Emergency Department (HOSPITAL_COMMUNITY)
Admission: EM | Admit: 2020-03-17 | Discharge: 2020-03-17 | Disposition: A | Discharge status: Home / Self Care | Payer: BC Managed Care – PPO | Attending: Emergency Medicine | Admitting: Emergency Medicine

## 2020-03-17 ENCOUNTER — Encounter (HOSPITAL_COMMUNITY): Payer: Self-pay

## 2020-03-17 DIAGNOSIS — X58XXXA Exposure to other specified factors, initial encounter: Secondary | ICD-10-CM | POA: Diagnosis not present

## 2020-03-17 DIAGNOSIS — T18128A Food in esophagus causing other injury, initial encounter: Secondary | ICD-10-CM | POA: Insufficient documentation

## 2020-03-17 DIAGNOSIS — T18108A Unspecified foreign body in esophagus causing other injury, initial encounter: Secondary | ICD-10-CM

## 2020-03-17 NOTE — ED Triage Notes (Signed)
Arrived POV from home. Patient reports chicken and rice is stuck in throat. NAD

## 2020-03-17 NOTE — ED Provider Notes (Signed)
[facility]-EMERGENCY DEPT Provider Note   CSN: [id number] Arrival date & time: 03/17/20  2106     History Chief Complaint  Patient presents with  . FOOD BOLUS    Eric Maddox is a 54 y.o. male.  Patient states that he got chicken stuck in his esophagus.  This is the third time it happened and he has had endoscopy twice before.  By the time I saw him in the emergency department he stated that he feels like it went through.  He is drinking water without any difficulty.  He will  The history is provided by the patient. No language interpreter was used.  Swallowed Foreign Body This is a new problem. The current episode started 3 to 5 hours ago. The problem occurs rarely. The problem has been resolved. Pertinent negatives include no chest pain, no abdominal pain and no headaches. Nothing aggravates the symptoms. Nothing relieves the symptoms. He has tried nothing for the symptoms. The treatment provided no relief.       History reviewed. No pertinent past medical history.  There are no problems to display for this patient.   Past Surgical History:  Procedure Laterality Date  . ESOPHAGOGASTRODUODENOSCOPY  07/02/2012   Procedure: ESOPHAGOGASTRODUODENOSCOPY (EGD);  Surgeon: Willis Modena, MD;  Location: [facility];  Service: Endoscopy;  Laterality: N/A;  . ESOPHAGOGASTRODUODENOSCOPY (EGD) WITH PROPOFOL N/A 09/04/2019   Procedure: ESOPHAGOGASTRODUODENOSCOPY (EGD) WITH PROPOFOL;  Surgeon: Kerin Salen, MD;  Location: [facility] ENDOSCOPY;  Service: Gastroenterology;  Laterality: N/A;  . FOREIGN BODY REMOVAL  09/04/2019   Procedure: FOREIGN BODY REMOVAL;  Surgeon: Kerin Salen, MD;  Location: [facility] ENDOSCOPY;  Service: Gastroenterology;;  . Gabriela Eves    . wisdom teeth removal         No family history on file.  Social History   Tobacco Use  . Smoking status: Never Smoker  . Smokeless tobacco: Never Used  Substance Use Topics  . Alcohol use: No  . Drug use: No     Home Medications Prior to Admission medications   Medication Sig Start Date End Date Taking? Authorizing Provider  EPINEPHrine (EPI-PEN) 0.3 mg/0.3 mL DEVI Inject 0.3 mg into the muscle as needed. For allergic reaction    [provider]  ibuprofen (ADVIL,MOTRIN) 200 MG tablet Take 200 mg by mouth every 6 (six) hours as needed for moderate pain.     [provider]  pantoprazole (PROTONIX) 40 MG tablet Take 1 tablet (40 mg total) by mouth 2 (two) times daily. 09/04/19 10/04/19  Kerin Salen, MD    Allergies    Bee venom and Other  Review of Systems   Review of Systems  Constitutional: Negative for appetite change and fatigue.  HENT: Negative for congestion, ear discharge and sinus pressure.   Eyes: Negative for discharge.  Respiratory: Negative for cough.   Cardiovascular: Negative for chest pain.  Gastrointestinal: Negative for abdominal pain and diarrhea.       Chicken in esophagus  Genitourinary: Negative for frequency and hematuria.  Musculoskeletal: Negative for back pain.  Skin: Negative for rash.  Neurological: Negative for seizures and headaches.  Psychiatric/Behavioral: Negative for hallucinations.    Physical Exam Updated Vital Signs BP (!) 158/97 (BP Location: Left Arm)   Pulse 70   Temp 98.2 F (36.8 C) (Oral)   Resp (!) 21   Ht 6\' 2"  (1.88 m)   Wt 117.9 kg   SpO2 97%   BMI 33.38 kg/m   Physical Exam Vitals and nursing  note reviewed.  Constitutional:      Appearance: He is well-developed.  HENT:     Head: Normocephalic.  Eyes:     General: No scleral icterus.    Conjunctiva/sclera: Conjunctivae normal.  Neck:     Thyroid: No thyromegaly.  Cardiovascular:     Rate and Rhythm: Normal rate and regular rhythm.     Heart sounds: No murmur heard.  No friction rub. No gallop.   Pulmonary:     Breath sounds: No stridor. No wheezing or rales.  Chest:     Chest wall: No tenderness.  Abdominal:     General: There is no distension.      Tenderness: There is no abdominal tenderness. There is no rebound.  Musculoskeletal:        General: Normal range of motion.     Cervical back: Neck supple.  Lymphadenopathy:     Cervical: No cervical adenopathy.  Skin:    Findings: No erythema or rash.  Neurological:     Mental Status: He is alert and oriented to person, place, and time.     Motor: No abnormal muscle tone.     Coordination: Coordination normal.  Psychiatric:        Behavior: Behavior normal.     ED Results / Procedures / Treatments   Labs (all labs ordered are listed, but only abnormal results are displayed) Labs Reviewed - No data to display  EKG None  Radiology No results found.  Procedures Procedures (including critical care time)  Medications Ordered in ED Medications - No data to display  ED Course  I have reviewed the triage vital signs and the nursing notes.  Pertinent labs & imaging results that were available during my care of the patient were reviewed by me and considered in my medical decision making (see chart for details).    MDM Rules/Calculators/A&P                          Patient with food bolus of chicken in his esophagus causing obstruction.  It has gone through his esophagus and the patient is relieved now.  He is drinking fluids without problems.  He will follow-up with his GI doctor and only drink liquids until seen Final Clinical Impression(s) / ED Diagnoses Final diagnoses:  Foreign body in esophagus, initial encounter    Rx / DC Orders ED Discharge Orders    None       Bethann Berkshire, MD 03/17/20 2222

## 2020-03-17 NOTE — Discharge Instructions (Addendum)
Take liquids only for the weekend and call your GI doctor on Monday and let them know what is going on return if any problems

## 2020-03-20 ENCOUNTER — Encounter (HOSPITAL_COMMUNITY): Payer: Self-pay | Admitting: Emergency Medicine

## 2020-03-20 ENCOUNTER — Emergency Department (HOSPITAL_COMMUNITY)
Admission: EM | Admit: 2020-03-20 | Discharge: 2020-03-21 | Disposition: A | Discharge status: Home / Self Care | Payer: BC Managed Care – PPO | Attending: Emergency Medicine | Admitting: Emergency Medicine

## 2020-03-20 ENCOUNTER — Other Ambulatory Visit: Payer: Self-pay

## 2020-03-20 DIAGNOSIS — Z20822 Contact with and (suspected) exposure to covid-19: Secondary | ICD-10-CM | POA: Diagnosis not present

## 2020-03-20 DIAGNOSIS — K200 Eosinophilic esophagitis: Secondary | ICD-10-CM | POA: Diagnosis not present

## 2020-03-20 DIAGNOSIS — T18128A Food in esophagus causing other injury, initial encounter: Secondary | ICD-10-CM | POA: Diagnosis not present

## 2020-03-20 DIAGNOSIS — K222 Esophageal obstruction: Secondary | ICD-10-CM

## 2020-03-20 DIAGNOSIS — N289 Disorder of kidney and ureter, unspecified: Secondary | ICD-10-CM | POA: Diagnosis not present

## 2020-03-20 DIAGNOSIS — X58XXXA Exposure to other specified factors, initial encounter: Secondary | ICD-10-CM | POA: Diagnosis not present

## 2020-03-20 NOTE — ED Triage Notes (Signed)
Patient complaining of eating sphagetti and meatballs. Patient things that it is stuck in his throat. Patient states small sips of water is not going down.

## 2020-03-20 NOTE — ED Provider Notes (Signed)
[facility]-EMERGENCY DEPT Provider Note   CSN: [id number] Arrival date & time: 03/20/20  2109   History Chief Complaint  Patient presents with  . Dysphagia    Eric Maddox is a 54 y.o. male.  The history is provided by the patient.  He has history of eosinophilic esophagitis and comes in with food stuck in his upper esophagus. He was eating spaghetti and meatballs when food got stuck at about 7 PM. He has not been able to swallow anything since then. He had been in the ED 3 days ago with similar symptoms at which time he had been eating chicken and rice, but that apparently passed before he needed to have endoscopy. Of note, he has been vaccinated against COVID-19.  History reviewed. No pertinent past medical history.  There are no problems to display for this patient.   Past Surgical History:  Procedure Laterality Date  . ESOPHAGOGASTRODUODENOSCOPY  07/02/2012   Procedure: ESOPHAGOGASTRODUODENOSCOPY (EGD);  Surgeon: Willis Modena, MD;  Location: [facility];  Service: Endoscopy;  Laterality: N/A;  . ESOPHAGOGASTRODUODENOSCOPY (EGD) WITH PROPOFOL N/A 09/04/2019   Procedure: ESOPHAGOGASTRODUODENOSCOPY (EGD) WITH PROPOFOL;  Surgeon: Kerin Salen, MD;  Location: [facility] ENDOSCOPY;  Service: Gastroenterology;  Laterality: N/A;  . FOREIGN BODY REMOVAL  09/04/2019   Procedure: FOREIGN BODY REMOVAL;  Surgeon: Kerin Salen, MD;  Location: [facility] ENDOSCOPY;  Service: Gastroenterology;;  . Gabriela Eves    . wisdom teeth removal         History reviewed. No pertinent family history.  Social History   Tobacco Use  . Smoking status: Never Smoker  . Smokeless tobacco: Never Used  Substance Use Topics  . Alcohol use: No  . Drug use: No    Home Medications Prior to Admission medications   Medication Sig Start Date End Date Taking? Authorizing Provider  EPINEPHrine (EPI-PEN) 0.3 mg/0.3 mL DEVI Inject 0.3 mg into the muscle as needed. For allergic reaction    [provider]  ibuprofen (ADVIL,MOTRIN) 200 MG tablet Take 200 mg by mouth every 6 (six) hours as needed for moderate pain.     [provider]  pantoprazole (PROTONIX) 40 MG tablet Take 1 tablet (40 mg total) by mouth 2 (two) times daily. 09/04/19 10/04/19  Kerin Salen, MD    Allergies    Bee venom and Other  Review of Systems   Review of Systems  All other systems reviewed and are negative.   Physical Exam Updated Vital Signs BP (!) 137/101 (BP Location: Right Arm)   Pulse 87   Temp 98.1 F (36.7 C) (Oral)   Resp 17   Ht 6\' 2"  (1.88 m)   Wt 117.9 kg   SpO2 94%   BMI 33.38 kg/m   Physical Exam Vitals and nursing note reviewed.   54 year old male, resting comfortably and in no acute distress. Vital signs are significant for elevated blood pressure. Oxygen saturation is 94%, which is normal. Head is normocephalic and atraumatic. PERRLA, EOMI. Oropharynx is clear. Neck is nontender and supple without adenopathy or JVD. Back is nontender and there is no CVA tenderness. Lungs are clear without rales, wheezes, or rhonchi. Chest is nontender. Heart has regular rate and rhythm without murmur. Abdomen is soft, flat, nontender without masses or hepatosplenomegaly and peristalsis is normoactive. Extremities have no cyanosis or edema, full range of motion is present. Skin is warm and dry without rash. Neurologic: Mental status is normal, cranial nerves are intact, there are no motor or sensory  deficits.  ED Results / Procedures / Treatments   Labs (all labs ordered are listed, but only abnormal results are displayed) Labs Reviewed  BASIC METABOLIC PANEL - Abnormal; Notable for the following components:      Result Value   Glucose, Bld 113 (*)    BUN 25 (*)    Creatinine, Ser 1.59 (*)    GFR calc non Af Amer 48 (*)    GFR calc Af Amer 56 (*)    All other components within normal limits  RESPIRATORY PANEL BY RT PCR (FLU A&B, COVID)  CBC WITH DIFFERENTIAL/PLATELET     Procedures Procedures   Medications Ordered in ED Medications  glucagon (human recombinant) (GLUCAGEN) injection 1 mg (1 mg Intravenous Given 03/21/20 0100)    ED Course  I have reviewed the triage vital signs and the nursing notes.  Pertinent lab results that were available during my care of the patient were reviewed by me and considered in my medical decision making (see chart for details).  MDM Rules/Calculators/A&P Esophageal obstruction from food bolus. Old records reviewed confirming ED visit on 9/25 with esophageal obstruction, and another ED visit on 3/14 at which time he needed endoscopy to clear the obstruction. He will be given a trial of glucagon, but I do not feel it is likely to be helpful. If it fails, will need urgent GI consultation.  There was no response to glucagon.  Labs show renal insufficiency slightly worse than April 2021, Covid PCR negative.  Case is discussed with Dr. Evette Cristal, on-call for gastroenterology who agrees to come to evaluate the patient for possible emergent endoscopy.  Final Clinical Impression(s) / ED Diagnoses Final diagnoses:  Esophageal obstruction due to food impaction  Renal insufficiency    Rx / DC Orders ED Discharge Orders    None       Dione Booze, MD 03/21/20 2243

## 2020-03-21 ENCOUNTER — Emergency Department (HOSPITAL_COMMUNITY): Payer: BC Managed Care – PPO | Admitting: Registered Nurse

## 2020-03-21 ENCOUNTER — Other Ambulatory Visit: Payer: Self-pay | Admitting: Orthopedic Surgery

## 2020-03-21 ENCOUNTER — Encounter (HOSPITAL_COMMUNITY): Payer: Self-pay | Admitting: Registered Nurse

## 2020-03-21 ENCOUNTER — Encounter (HOSPITAL_COMMUNITY)
Admission: EM | Disposition: A | Discharge status: Home / Self Care | Payer: Self-pay | Source: Home / Self Care | Attending: Emergency Medicine

## 2020-03-21 DIAGNOSIS — M25511 Pain in right shoulder: Secondary | ICD-10-CM

## 2020-03-21 HISTORY — PX: FOREIGN BODY REMOVAL: SHX962

## 2020-03-21 HISTORY — PX: ESOPHAGOGASTRODUODENOSCOPY (EGD) WITH PROPOFOL: SHX5813

## 2020-03-21 LAB — CBC WITH DIFFERENTIAL/PLATELET
Abs Immature Granulocytes: 0.01 10*3/uL (ref 0.00–0.07)
Basophils Absolute: 0.1 10*3/uL (ref 0.0–0.1)
Basophils Relative: 1 %
Eosinophils Absolute: 0.1 10*3/uL (ref 0.0–0.5)
Eosinophils Relative: 2 %
HCT: 48.8 % (ref 39.0–52.0)
Hemoglobin: 16.4 g/dL (ref 13.0–17.0)
Immature Granulocytes: 0 %
Lymphocytes Relative: 29 %
Lymphs Abs: 2.2 10*3/uL (ref 0.7–4.0)
MCH: 29.1 pg (ref 26.0–34.0)
MCHC: 33.6 g/dL (ref 30.0–36.0)
MCV: 86.5 fL (ref 80.0–100.0)
Monocytes Absolute: 0.6 10*3/uL (ref 0.1–1.0)
Monocytes Relative: 8 %
Neutro Abs: 4.5 10*3/uL (ref 1.7–7.7)
Neutrophils Relative %: 60 %
Platelets: 164 10*3/uL (ref 150–400)
RBC: 5.64 MIL/uL (ref 4.22–5.81)
RDW: 12.7 % (ref 11.5–15.5)
WBC: 7.6 10*3/uL (ref 4.0–10.5)
nRBC: 0 % (ref 0.0–0.2)

## 2020-03-21 LAB — BASIC METABOLIC PANEL
Anion gap: 12 (ref 5–15)
BUN: 25 mg/dL — ABNORMAL HIGH (ref 6–20)
CO2: 25 mmol/L (ref 22–32)
Calcium: 9.3 mg/dL (ref 8.9–10.3)
Chloride: 105 mmol/L (ref 98–111)
Creatinine, Ser: 1.59 mg/dL — ABNORMAL HIGH (ref 0.61–1.24)
GFR calc Af Amer: 56 mL/min — ABNORMAL LOW (ref >60)
GFR calc non Af Amer: 48 mL/min — ABNORMAL LOW (ref >60)
Glucose, Bld: 113 mg/dL — ABNORMAL HIGH (ref 70–99)
Potassium: 3.9 mmol/L (ref 3.5–5.1)
Sodium: 142 mmol/L (ref 135–145)

## 2020-03-21 LAB — RESPIRATORY PANEL BY RT PCR (FLU A&B, COVID)
Influenza A by PCR: NEGATIVE
Influenza B by PCR: NEGATIVE
SARS Coronavirus 2 by RT PCR: NEGATIVE

## 2020-03-21 SURGERY — ESOPHAGOGASTRODUODENOSCOPY (EGD) WITH PROPOFOL
Anesthesia: General

## 2020-03-21 MED ORDER — FENTANYL CITRATE (PF) 100 MCG/2ML IJ SOLN
INTRAMUSCULAR | Status: AC
  Filled 2020-03-21: qty 2

## 2020-03-21 MED ORDER — LABETALOL HCL 5 MG/ML IV SOLN
INTRAVENOUS | Status: DC | PRN
  Administered 2020-03-21: 5 mg via INTRAVENOUS

## 2020-03-21 MED ORDER — DEXAMETHASONE SODIUM PHOSPHATE 10 MG/ML IJ SOLN
INTRAMUSCULAR | Status: DC | PRN
  Administered 2020-03-21: 10 mg via INTRAVENOUS

## 2020-03-21 MED ORDER — PROPOFOL 10 MG/ML IV BOLUS
INTRAVENOUS | Status: AC
  Filled 2020-03-21: qty 20

## 2020-03-21 MED ORDER — ONDANSETRON HCL 4 MG/2ML IJ SOLN
INTRAMUSCULAR | Status: DC | PRN
  Administered 2020-03-21: 4 mg via INTRAVENOUS

## 2020-03-21 MED ORDER — FENTANYL CITRATE (PF) 100 MCG/2ML IJ SOLN
INTRAMUSCULAR | Status: DC | PRN
Start: 2020-03-21 — End: 2020-03-21
  Administered 2020-03-21: 50 ug via INTRAVENOUS

## 2020-03-21 MED ORDER — PROPOFOL 10 MG/ML IV BOLUS
INTRAVENOUS | Status: DC | PRN
  Administered 2020-03-21: 200 mg via INTRAVENOUS

## 2020-03-21 MED ORDER — GLUCAGON HCL RDNA (DIAGNOSTIC) 1 MG IJ SOLR
1.0000 mg | Freq: Once | INTRAMUSCULAR | Status: AC
  Administered 2020-03-21: 1 mg via INTRAVENOUS
  Filled 2020-03-21: qty 1

## 2020-03-21 MED ORDER — LIDOCAINE 2% (20 MG/ML) 5 ML SYRINGE
INTRAMUSCULAR | Status: DC | PRN
  Administered 2020-03-21: 75 mg via INTRAVENOUS
  Administered 2020-03-21: 25 mg via INTRAVENOUS

## 2020-03-21 MED ORDER — SODIUM CHLORIDE 0.9 % IV SOLN: INTRAVENOUS | Status: DC

## 2020-03-21 MED ORDER — SUCCINYLCHOLINE CHLORIDE 200 MG/10ML IV SOSY
PREFILLED_SYRINGE | INTRAVENOUS | Status: DC | PRN
  Administered 2020-03-21: 140 mg via INTRAVENOUS

## 2020-03-21 MED ORDER — LACTATED RINGERS IV SOLN
INTRAVENOUS | Status: AC | PRN
  Administered 2020-03-21: 1000 mL via INTRAVENOUS

## 2020-03-21 SURGICAL SUPPLY — 14 items

## 2020-03-21 NOTE — Anesthesia Preprocedure Evaluation (Signed)
Anesthesia Evaluation  Patient identified by MRN, date of birth, ID band Patient awake    Reviewed: Allergy & Precautions, NPO status , Patient's Chart, lab work & pertinent test results  Airway Mallampati: II  TM Distance: >3 FB Neck ROM: Full    Dental  (+) Teeth Intact, Dental Advisory Given   Pulmonary neg pulmonary ROS,    breath sounds clear to auscultation       Cardiovascular negative cardio ROS   Rhythm:Regular Rate:Normal     Neuro/Psych    GI/Hepatic Neg liver ROS,   Endo/Other  negative endocrine ROS  Renal/GU negative Renal ROS     Musculoskeletal   Abdominal Normal abdominal exam  (+)   Peds  Hematology negative hematology ROS (+)   Anesthesia Other Findings   Reproductive/Obstetrics                             Anesthesia Physical Anesthesia Plan  ASA: I and emergent  Anesthesia Plan: General   Post-op Pain Management:    Induction: Intravenous, Rapid sequence and Cricoid pressure planned  PONV Risk Score and Plan: 2 and Ondansetron and Midazolam  Airway Management Planned: Oral ETT  Additional Equipment: None  Intra-op Plan:   Post-operative Plan: Extubation in OR  Informed Consent: I have reviewed the patients History and Physical, chart, labs and discussed the procedure including the risks, benefits and alternatives for the proposed anesthesia with the patient or authorized representative who has indicated his/her understanding and acceptance.     Dental advisory given  Plan Discussed with: CRNA  Anesthesia Plan Comments: (Covid-19 Nucleic Acid Test Results Lab Results      Component                Value               Date                      SARSCOV2NAA              NEGATIVE            03/21/2020                SARSCOV2NAA              NEGATIVE            09/04/2019           )        Anesthesia Quick Evaluation

## 2020-03-21 NOTE — Discharge Instructions (Addendum)
With all food, but especially meats, take small bites and chew thoroughly before swallowing.

## 2020-03-21 NOTE — Transfer of Care (Signed)
Immediate Anesthesia Transfer of Care Note  Patient: Eric Maddox  Procedure(s) Performed: ESOPHAGOGASTRODUODENOSCOPY (EGD) WITH PROPOFOL (N/A )  Patient Location: PACU  Anesthesia Type:General  Level of Consciousness: awake, alert , oriented and patient cooperative  Airway & Oxygen Therapy: Patient Spontanous Breathing and Patient connected to face mask oxygen  Post-op Assessment: Report given to RN, Post -op Vital signs reviewed and stable and Patient moving all extremities X 4  Post vital signs: stable  Last Vitals:  Vitals Value Taken Time  BP 136/84 03/21/20 0450  Temp 36.5 C 03/21/20 0440  Pulse 66 03/21/20 0457  Resp 15 03/21/20 0453  SpO2 93 % 03/21/20 0457  Vitals shown include unvalidated device data.  Last Pain:  Vitals:   03/21/20 0440  TempSrc: Oral  PainSc: 0-No pain         Complications: No complications documented.

## 2020-03-21 NOTE — Anesthesia Postprocedure Evaluation (Signed)
Anesthesia Post Note  Patient: Eric Maddox  Procedure(s) Performed: ESOPHAGOGASTRODUODENOSCOPY (EGD) WITH PROPOFOL (N/A )     Patient location during evaluation: PACU Anesthesia Type: General Level of consciousness: awake and alert Pain management: pain level controlled Vital Signs Assessment: post-procedure vital signs reviewed and stable Respiratory status: spontaneous breathing, nonlabored ventilation, respiratory function stable and patient connected to nasal cannula oxygen Cardiovascular status: blood pressure returned to baseline and stable Postop Assessment: no apparent nausea or vomiting Anesthetic complications: no   No complications documented.  Last Vitals:  Vitals:   03/21/20 0440 03/21/20 0441  BP: 127/80 132/82  Pulse: 74 79  Resp: 18 12  Temp: 36.5 C   SpO2: 97% 97%    Last Pain:  Vitals:   03/21/20 0440  TempSrc: Oral  PainSc: 0-No pain                 Shelton Silvas

## 2020-03-21 NOTE — Op Note (Signed)
Brief op note:  EGD with foreign body removal:  Meat impaction removed from the esophagus without difficulty.  Esophageal appearance looks like eosinophilic esophagitis.  No immediate complications

## 2020-03-21 NOTE — Anesthesia Procedure Notes (Signed)
Procedure Name: Intubation Date/Time: 03/21/2020 4:12 AM Performed by: Lissa Morales, CRNA Pre-anesthesia Checklist: Patient identified, Emergency Drugs available, Suction available and Patient being monitored Patient Re-evaluated:Patient Re-evaluated prior to induction Oxygen Delivery Method: Circle system utilized Preoxygenation: Pre-oxygenation with 100% oxygen Induction Type: IV induction, Cricoid Pressure applied and Rapid sequence Ventilation: Mask ventilation without difficulty Laryngoscope Size: Mac and 4 Grade View: Grade II Tube type: Oral Number of attempts: 1 Airway Equipment and Method: Stylet and Oral airway Placement Confirmation: ETT inserted through vocal cords under direct vision,  positive ETCO2 and breath sounds checked- equal and bilateral Secured at: 22 cm Tube secured with: Tape Dental Injury: Teeth and Oropharynx as per pre-operative assessment

## 2020-03-21 NOTE — ED Notes (Signed)
Patient transported to endo.  

## 2020-03-21 NOTE — H&P (Signed)
Preprocedure history and physical  Patient is a 54 year old male with a history of eosinophilic esophagitis.  He has had food impactions in the esophagus in the past on 2 occasions requiring endoscopic removal.  He presents to the emergency room with complaints of dysphagia.  He had been eating spaghetti and meatballs around 7:00 and could not swallow afterwards.  We were called by the emergency room because of this for EGD with foreign body removal  Past history: Eosinophilic esophagitis  Medications none except occasional ibuprofen  Allergies: None to medications  Physical: No distress  Nonicteric  Heart regular rhythm no murmurs  Lungs clear  Abdomen soft and nontender  Impression esophageal foreign body probably a meat impaction  Plan EGD with foreign body removal

## 2020-03-21 NOTE — Op Note (Signed)
[facility] Patient Name: Eric Maddox Procedure Date: 03/21/2020 MRN: [id number] Attending MD: Graylin Shiver , MD Date of Birth: 25-Jun-1965 CSN: [id number] Age: 54 Admit Type: Outpatient Procedure:                Upper GI endoscopy Indications:              Dysphagia Providers:                Graylin Shiver, MD, Glory Rosebush, RN, Kandice Robinsons, Technician Referring MD:              Medicines:                Monitored Anesthesia Care Complications:            No immediate complications. Estimated Blood Loss:     Estimated blood loss: none. Procedure:                Pre-Anesthesia Assessment:                           - Prior to the procedure, a History and Physical                            was performed, and patient medications and                            allergies were reviewed. The patient's tolerance of                            previous anesthesia was also reviewed. The risks                            and benefits of the procedure and the sedation                            options and risks were discussed with the patient.                            All questions were answered, and informed consent                            was obtained. Prior Anticoagulants: The patient has                            taken no previous anticoagulant or antiplatelet                            agents. ASA Grade Assessment: I - A normal, healthy                            patient. After reviewing the risks and benefits,  the patient was deemed in satisfactory condition to                            undergo the procedure.                           After obtaining informed consent, the endoscope was                            passed under direct vision. Throughout the                            procedure, the patient's blood pressure, pulse, and                            oxygen saturations were monitored continuously.  The                            [id number]-[id number] ([id number]) was introduced through the                            mouth, and advanced to the second part of duodenum.                            The upper GI endoscopy was accomplished without                            difficulty. The patient tolerated the procedure                            well. Scope In: Scope Out: Findings:      Food was found in the lower third of the esophagus. Appeared to be       consistent with meatballs (as patient mentioned). Food was able to be       passed int the stomach with gentle scope pressure. Removal of food was       accomplished.      Mucosal changes including ringed esophagus were found in the upper third       of the esophagus, in the middle third of the esophagus and in the lower       third of the esophagus.      The entire examined stomach was normal.      The examined duodenum was normal. Impression:               - Food in the lower third of the esophagus. Removal                            was successful.                           - Esophageal mucosal changes secondary to                            eosinophilic esophagitis.                           -  Normal stomach.                           - Normal examined duodenum. Moderate Sedation:      . Recommendation:           - Mechanical soft diet.                           - Continue present medications.                           - Return to GI clinic. Dr Dulce Sellar. Procedure Code(s):        --- Professional ---                           [phone], Esophagogastroduodenoscopy, flexible,                            transoral; with removal of foreign body(s) Diagnosis Code(s):        --- Professional ---                           M07.680S, Food in esophagus causing other injury,                            initial encounter                           K20.0, Eosinophilic esophagitis                           R13.10, Dysphagia, unspecified CPT copyright 2019  American Medical Association. All rights reserved. The codes documented in this report are preliminary and upon coder review may  be revised to meet current compliance requirements. Graylin Shiver, MD 03/21/2020 4:48:50 AM This report has been signed electronically. Number of Addenda: 0

## 2020-03-22 ENCOUNTER — Encounter (HOSPITAL_COMMUNITY): Payer: Self-pay | Admitting: Gastroenterology

## 2020-04-16 ENCOUNTER — Ambulatory Visit
Admission: RE | Admit: 2020-04-16 | Discharge: 2020-04-16 | Disposition: A | Discharge status: Home / Self Care | Payer: BC Managed Care – PPO | Source: Ambulatory Visit | Attending: Orthopedic Surgery | Admitting: Orthopedic Surgery

## 2020-04-16 DIAGNOSIS — M25511 Pain in right shoulder: Secondary | ICD-10-CM

## 2020-06-05 DIAGNOSIS — M75120 Complete rotator cuff tear or rupture of unspecified shoulder, not specified as traumatic: Secondary | ICD-10-CM | POA: Insufficient documentation

## 2020-06-05 DIAGNOSIS — M19011 Primary osteoarthritis, right shoulder: Secondary | ICD-10-CM | POA: Insufficient documentation

## 2020-07-10 IMAGING — US US RENAL
1 series · 14 of 25 positions shown · non-contrast
Comparison: None.

CLINICAL DATA: Abnormal renal function.

EXAM:
RENAL / URINARY TRACT ULTRASOUND COMPLETE

[Series 1: us renal · 0.26mm/px · 14 of 32 slices shown]
[im 1/32]
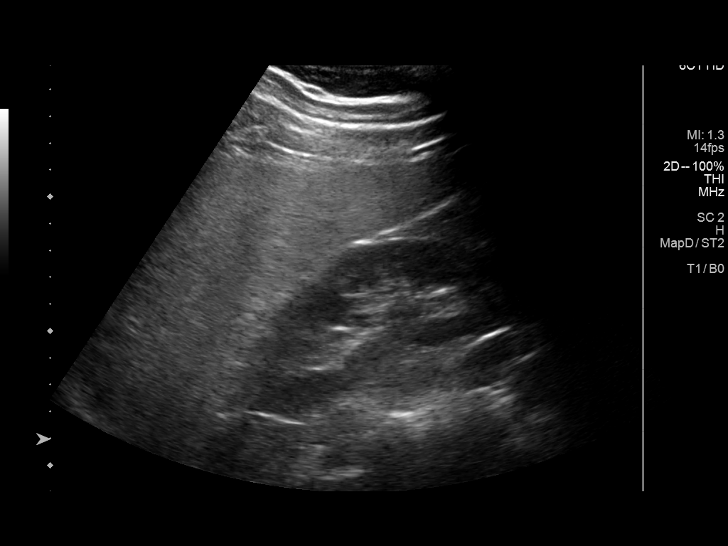
[im 3/32]
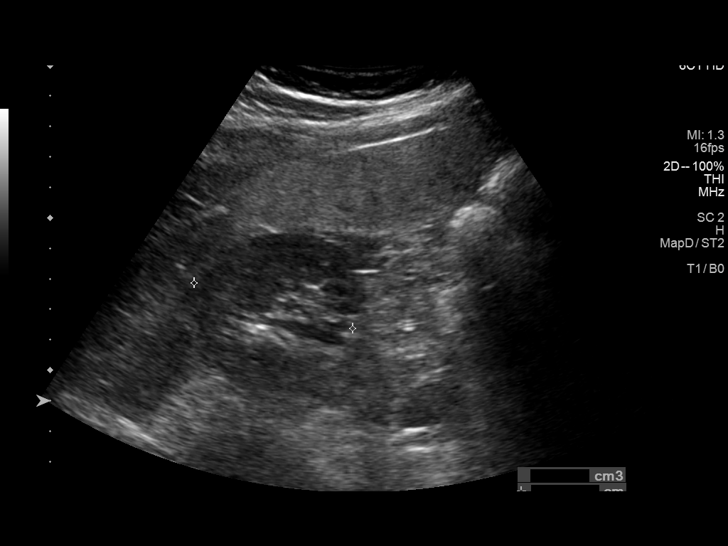
[im 6/32]
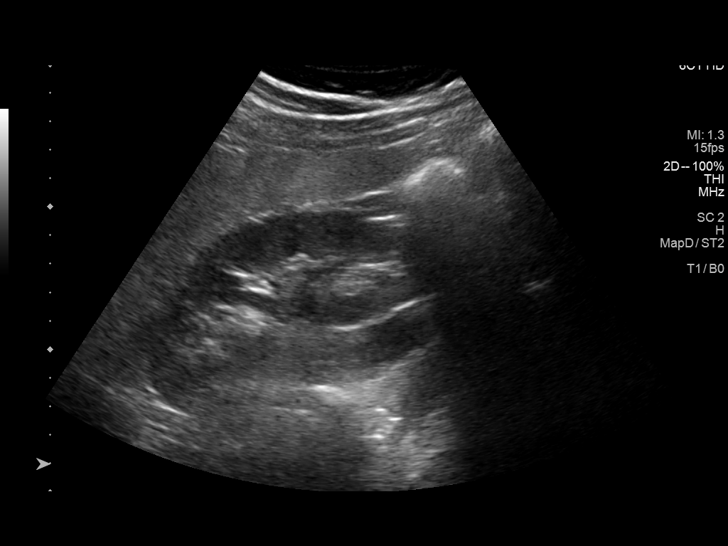
[im 8/32]
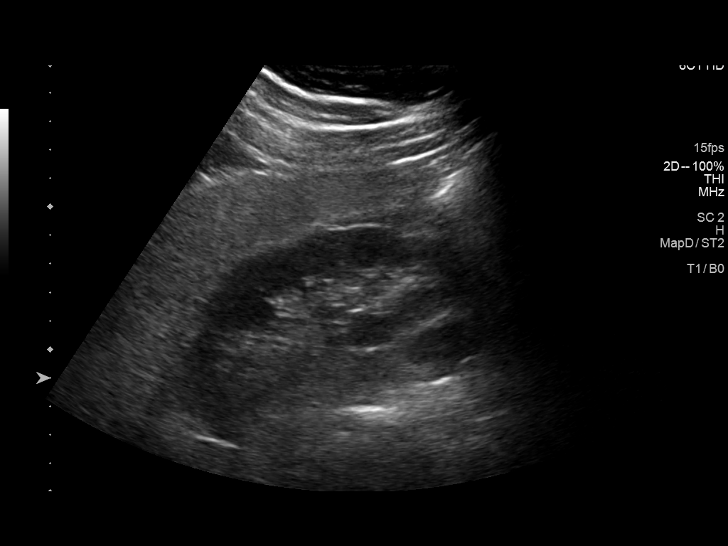
[im 11/32]
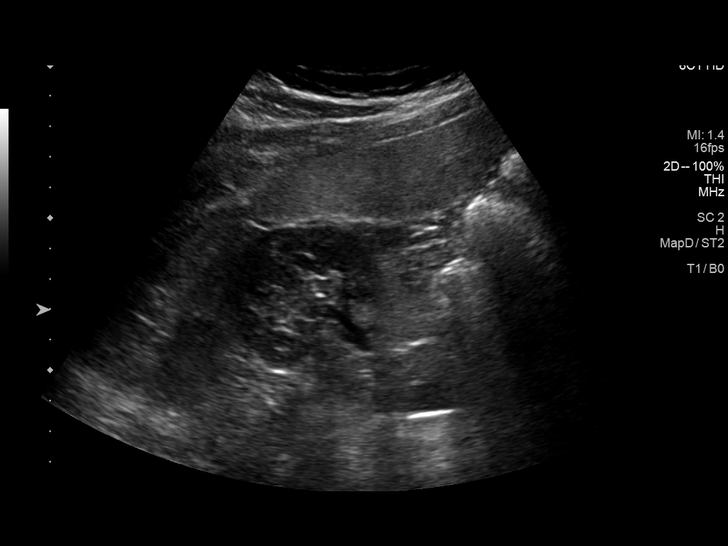
[im 12/32]
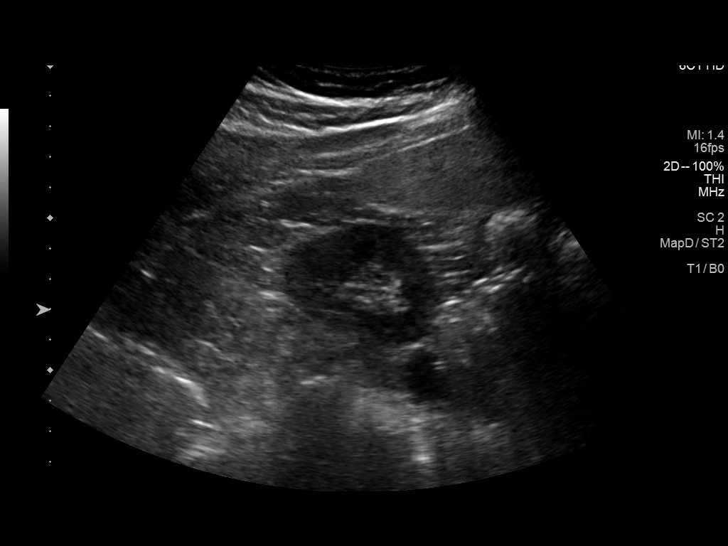
[im 15/32]
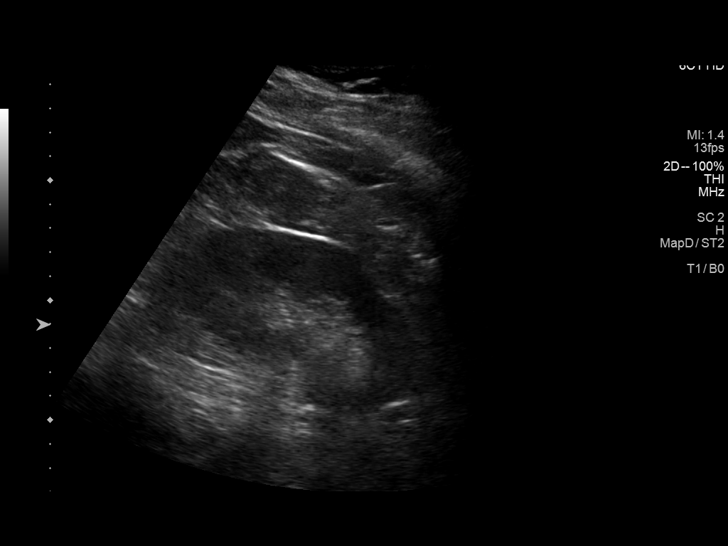
[im 17/32]
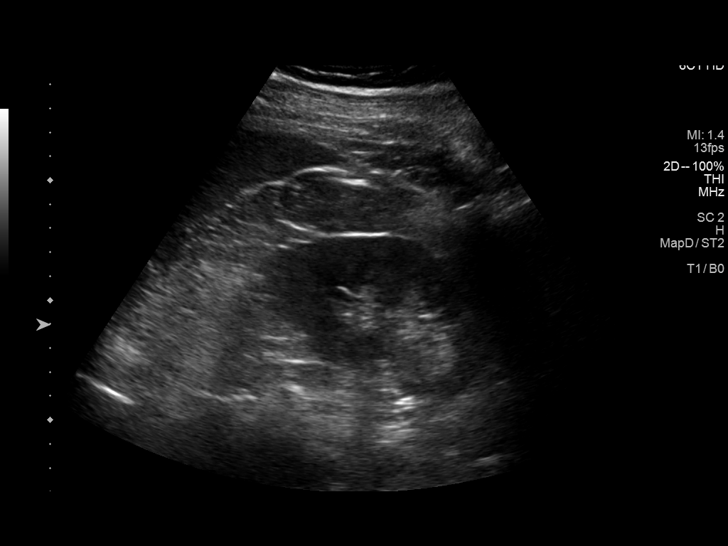
[im 20/32]
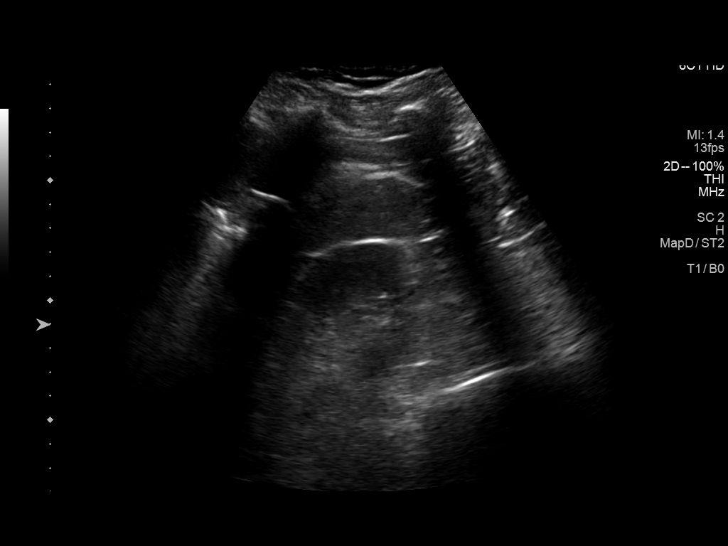
[im 21/32]
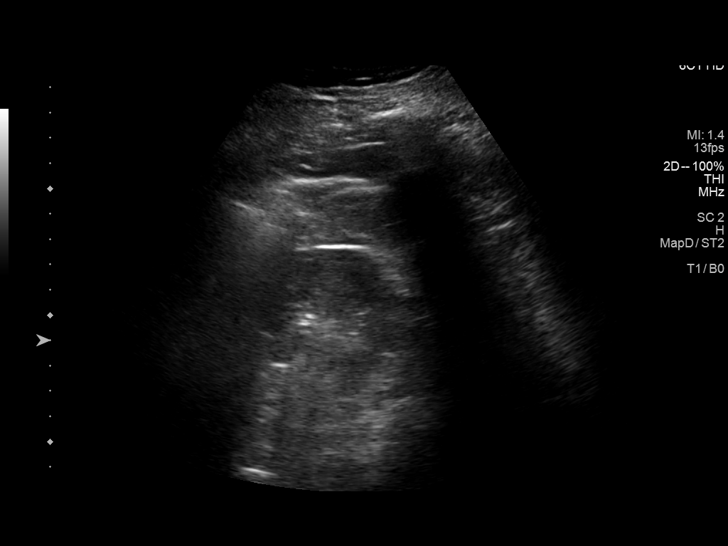
[im 24/32]
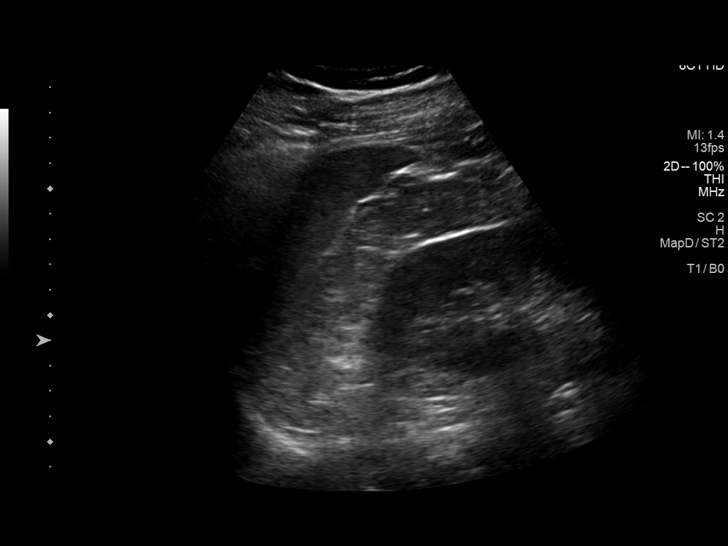
[im 26/32]
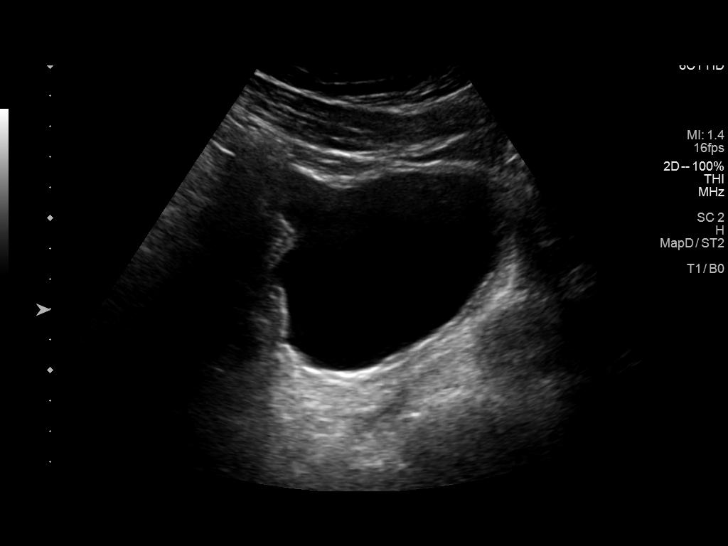
[im 29/32]
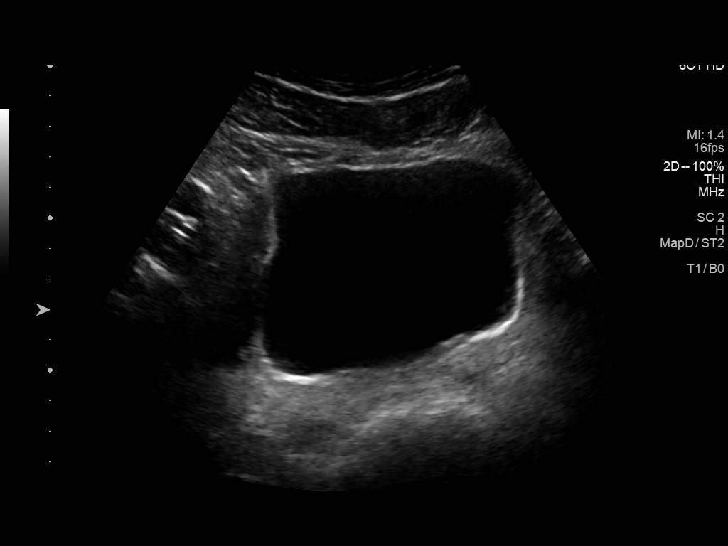
[im 32/32]
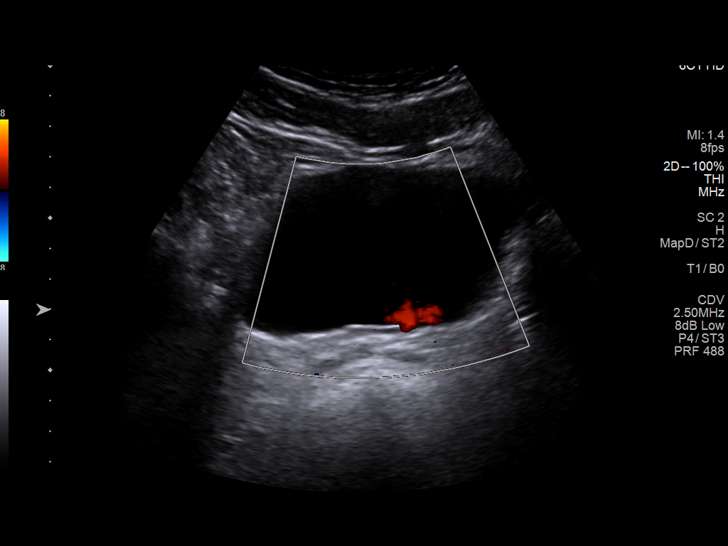

[14 of 25 positions shown; findings below may reference images not displayed]

FINDINGS: Right Kidney:

Renal measurements: 12.2 x 6.2 x 5.4 cm = volume: 215 mL.
Echogenicity within normal limits. No mass or hydronephrosis
visualized. No evidence of renal calculi.

Left Kidney:

Renal measurements: 12.1 x 6.2 x 5.3 cm = volume: 207 mL.
Echogenicity within normal limits. No mass or hydronephrosis
visualized.

Bladder:

Appears normal for degree of bladder distention. Both ureteral jets
are visualized.

Other:

Incidental note of increased hepatic echogenicity suggesting
steatosis.
IMPRESSION: 1. Unremarkable renal ultrasound.
2. Incidental note of hepatic steatosis.

## 2021-12-25 DIAGNOSIS — M79672 Pain in left foot: Secondary | ICD-10-CM | POA: Insufficient documentation

## 2022-01-03 ENCOUNTER — Ambulatory Visit: Payer: BC Managed Care – PPO | Admitting: Podiatry

## 2022-09-02 ENCOUNTER — Ambulatory Visit (INDEPENDENT_AMBULATORY_CARE_PROVIDER_SITE_OTHER): Payer: BC Managed Care – PPO

## 2022-09-02 ENCOUNTER — Ambulatory Visit: Payer: BC Managed Care – PPO | Admitting: Podiatry

## 2022-09-02 ENCOUNTER — Encounter: Payer: Self-pay | Admitting: Podiatry

## 2022-09-02 DIAGNOSIS — M109 Gout, unspecified: Secondary | ICD-10-CM

## 2022-09-02 MED ORDER — METHYLPREDNISOLONE 4 MG PO TBPK: ORAL_TABLET | ORAL | 0 refills | Status: AC

## 2022-09-02 MED ORDER — COLCHICINE 0.6 MG PO TABS: 0.6000 mg | ORAL_TABLET | Freq: Every day | ORAL | 0 refills | Status: DC

## 2022-09-03 ENCOUNTER — Encounter: Payer: Self-pay | Admitting: Podiatry

## 2022-09-03 NOTE — Progress Notes (Signed)
  Subjective:  Patient ID: Eric Maddox, male    DOB: 08-29-1965,  MRN: [id number]  Chief Complaint  Patient presents with   Toe Pain    np re est care last seen 2020/ left great toe pain and swelling    57 y.o. male presents with the above complaint. History confirmed with patient.  He noted Fuchs ago the toe started to come sore and swollen went away and then came back and was very swollen painful and red  Objective:  Physical Exam: warm, good capillary refill, no trophic changes or ulcerative lesions, normal DP and PT pulses, normal sensory exam, and largely swollen interphalangeal joint with erythema pain and tenderness and limited range of motion   Radiographs: Multiple views x-ray of the left foot: Periarticular erosion at the head of the proximal phalanx medially, significant soft tissue increased density and swelling Assessment:   1. Gouty arthritis of left great toe      Plan:  Patient was evaluated and treated and all questions answered.   We reviewed his x-rays together.  Discussed this is likely is a gouty flare.  To his knowledge has not had this before.  I recommended checking lab work and his uric acid, CBC and metabolic panel were ordered and he will check this at [place].  I recommended therapy with colchicine as well.  Rx has been sent by my partner Dr. Posey Pronto for this as well as methylprednisolone Dosepak.  I will let him know what his lab work shows.  I will see him back as needed if it worsens or returns.  We discussed the prevalence of a low purine diet that can help to alleviate gout flares.  Discussed referral to rheumatology or PCP for chronic management if it continues to be recurrent issue  No follow-ups on file.

## 2022-09-04 ENCOUNTER — Ambulatory Visit: Payer: BC Managed Care – PPO | Admitting: Podiatry

## 2022-09-04 LAB — CBC WITH DIFFERENTIAL/PLATELET
Basophils Absolute: 0 x10E3/uL (ref 0.0–0.2)
Basos: 1 %
EOS (ABSOLUTE): 0.2 x10E3/uL (ref 0.0–0.4)
Eos: 3 %
Hematocrit: 46.6 % (ref 37.5–51.0)
Hemoglobin: 15.5 g/dL (ref 13.0–17.7)
Immature Grans (Abs): 0 x10E3/uL (ref 0.0–0.1)
Immature Granulocytes: 0 %
Lymphocytes Absolute: 1.4 x10E3/uL (ref 0.7–3.1)
Lymphs: 18 %
MCH: 27.9 pg (ref 26.6–33.0)
MCHC: 33.3 g/dL (ref 31.5–35.7)
MCV: 84 fL (ref 79–97)
Monocytes Absolute: 0.5 x10E3/uL (ref 0.1–0.9)
Monocytes: 6 %
Neutrophils Absolute: 5.7 x10E3/uL (ref 1.4–7.0)
Neutrophils: 72 %
Platelets: 189 x10E3/uL (ref 150–450)
RBC: 5.56 x10E6/uL (ref 4.14–5.80)
RDW: 13 % (ref 11.6–15.4)
WBC: 7.8 x10E3/uL (ref 3.4–10.8)

## 2022-09-04 LAB — BASIC METABOLIC PANEL WITH GFR
BUN/Creatinine Ratio: 16 (ref 9–20)
BUN: 22 mg/dL (ref 6–24)
CO2: 22 mmol/L (ref 20–29)
Calcium: 9.7 mg/dL (ref 8.7–10.2)
Chloride: 99 mmol/L (ref 96–106)
Creatinine, Ser: 1.36 mg/dL — ABNORMAL HIGH (ref 0.76–1.27)
Glucose: 101 mg/dL — ABNORMAL HIGH (ref 70–99)
Potassium: 4.2 mmol/L (ref 3.5–5.2)
Sodium: 139 mmol/L (ref 134–144)
eGFR: 61 mL/min/1.73 (ref >59)

## 2022-09-04 LAB — URIC ACID: Uric Acid: 7.8 mg/dL (ref 3.8–8.4)

## 2022-09-24 ENCOUNTER — Other Ambulatory Visit: Payer: Self-pay | Admitting: Podiatry
